# Patient Record
Sex: Male | Born: 1940 | Race: Black or African American | Hispanic: No | Smoking: Former smoker
Health system: Southern US, Community
[De-identification: ages and names within clinical notes are randomized; demographics above are authoritative.]

## PROBLEM LIST (undated history)

## (undated) DIAGNOSIS — H409 Unspecified glaucoma: Secondary | ICD-10-CM

## (undated) DIAGNOSIS — R569 Unspecified convulsions: Secondary | ICD-10-CM

## (undated) DIAGNOSIS — E785 Hyperlipidemia, unspecified: Secondary | ICD-10-CM

## (undated) DIAGNOSIS — G459 Transient cerebral ischemic attack, unspecified: Secondary | ICD-10-CM

## (undated) DIAGNOSIS — D126 Benign neoplasm of colon, unspecified: Secondary | ICD-10-CM

## (undated) DIAGNOSIS — I639 Cerebral infarction, unspecified: Secondary | ICD-10-CM

## (undated) DIAGNOSIS — I10 Essential (primary) hypertension: Secondary | ICD-10-CM

## (undated) DIAGNOSIS — C61 Malignant neoplasm of prostate: Secondary | ICD-10-CM

## (undated) DIAGNOSIS — E119 Type 2 diabetes mellitus without complications: Secondary | ICD-10-CM

## (undated) DIAGNOSIS — M1990 Unspecified osteoarthritis, unspecified site: Secondary | ICD-10-CM

## (undated) HISTORY — PX: DENTAL SURGERY: SHX609

## (undated) HISTORY — DX: Unspecified osteoarthritis, unspecified site: M19.90

## (undated) HISTORY — PX: MENISCUS REPAIR: SHX5179

## (undated) HISTORY — PX: TONSILLECTOMY: SUR1361

## (undated) HISTORY — PX: PROSTATE BIOPSY: SHX241

## (undated) HISTORY — DX: Hyperlipidemia, unspecified: E78.5

## (undated) HISTORY — PX: COLONOSCOPY: SHX174

## (undated) HISTORY — PX: VASECTOMY: SHX75

## (undated) HISTORY — PX: OTHER SURGICAL HISTORY: SHX169

## (undated) HISTORY — PX: POLYPECTOMY: SHX149

## (undated) HISTORY — DX: Malignant neoplasm of prostate: C61

## (undated) HISTORY — DX: Unspecified glaucoma: H40.9

## (undated) HISTORY — DX: Benign neoplasm of colon, unspecified: D12.6

---

## 2006-05-28 ENCOUNTER — Ambulatory Visit: Payer: Self-pay | Admitting: Internal Medicine

## 2006-07-25 ENCOUNTER — Ambulatory Visit: Payer: Self-pay | Admitting: Internal Medicine

## 2007-06-19 ENCOUNTER — Ambulatory Visit: Payer: Self-pay | Admitting: Internal Medicine

## 2007-06-19 LAB — CONVERTED CEMR LAB
Albumin: 4 g/dL (ref 3.5–5.2)
Alkaline Phosphatase: 214 units/L — ABNORMAL HIGH (ref 39–117)
BUN: 9 mg/dL (ref 6–23)
Basophils Absolute: 0.1 10*3/uL (ref 0.0–0.1)
CO2: 27 meq/L (ref 19–32)
Creatinine, Ser: 0.9 mg/dL (ref 0.4–1.5)
GFR calc Af Amer: 109 mL/min
HCT: 42.5 % (ref 39.0–52.0)
Hemoglobin: 14.3 g/dL (ref 13.0–17.0)
Lymphocytes Relative: 35.5 % (ref 12.0–46.0)
MCHC: 33.5 g/dL (ref 30.0–36.0)
Monocytes Absolute: 0.8 10*3/uL — ABNORMAL HIGH (ref 0.2–0.7)
Monocytes Relative: 8.9 % (ref 3.0–11.0)
Neutro Abs: 5.1 10*3/uL (ref 1.4–7.7)
Neutrophils Relative %: 53.3 % (ref 43.0–77.0)
PSA: 1.94 ng/mL (ref 0.10–4.00)
Potassium: 3.9 meq/L (ref 3.5–5.1)
RDW: 13.2 % (ref 11.5–14.6)
Sodium: 136 meq/L (ref 135–145)
TSH: 0.66 microintl units/mL (ref 0.35–5.50)
Total Bilirubin: 0.6 mg/dL (ref 0.3–1.2)
Total Protein: 7.4 g/dL (ref 6.0–8.3)

## 2007-07-02 ENCOUNTER — Ambulatory Visit: Payer: Self-pay | Admitting: Internal Medicine

## 2007-07-03 DIAGNOSIS — Z9189 Other specified personal risk factors, not elsewhere classified: Secondary | ICD-10-CM | POA: Insufficient documentation

## 2007-07-03 DIAGNOSIS — E785 Hyperlipidemia, unspecified: Secondary | ICD-10-CM | POA: Insufficient documentation

## 2007-07-03 DIAGNOSIS — Z8679 Personal history of other diseases of the circulatory system: Secondary | ICD-10-CM | POA: Insufficient documentation

## 2007-07-03 DIAGNOSIS — J309 Allergic rhinitis, unspecified: Secondary | ICD-10-CM | POA: Insufficient documentation

## 2007-07-03 DIAGNOSIS — I10 Essential (primary) hypertension: Secondary | ICD-10-CM | POA: Insufficient documentation

## 2007-07-03 DIAGNOSIS — M479 Spondylosis, unspecified: Secondary | ICD-10-CM | POA: Insufficient documentation

## 2007-07-03 DIAGNOSIS — R569 Unspecified convulsions: Secondary | ICD-10-CM | POA: Insufficient documentation

## 2007-07-05 ENCOUNTER — Ambulatory Visit: Payer: Self-pay | Admitting: Family Medicine

## 2007-07-08 ENCOUNTER — Ambulatory Visit: Payer: Self-pay | Admitting: Internal Medicine

## 2008-01-22 ENCOUNTER — Ambulatory Visit: Payer: Self-pay | Admitting: Internal Medicine

## 2008-06-23 ENCOUNTER — Ambulatory Visit: Payer: Self-pay | Admitting: Internal Medicine

## 2008-06-28 LAB — CONVERTED CEMR LAB
Albumin: 3.7 g/dL (ref 3.5–5.2)
Alkaline Phosphatase: 118 units/L — ABNORMAL HIGH (ref 39–117)
BUN: 11 mg/dL (ref 6–23)
Basophils Relative: 1 % (ref 0.0–1.0)
Cholesterol: 156 mg/dL (ref 0–200)
Creatinine, Ser: 0.9 mg/dL (ref 0.4–1.5)
Eosinophils Relative: 1.4 % (ref 0.0–5.0)
GFR calc Af Amer: 109 mL/min
Glucose, Bld: 116 mg/dL — ABNORMAL HIGH (ref 70–99)
HCT: 42 % (ref 39.0–52.0)
Hemoglobin: 14.2 g/dL (ref 13.0–17.0)
MCV: 90.2 fL (ref 78.0–100.0)
Monocytes Absolute: 0.5 10*3/uL (ref 0.1–1.0)
Monocytes Relative: 6.3 % (ref 3.0–12.0)
Neutro Abs: 4.5 10*3/uL (ref 1.4–7.7)
Nitrite: NEGATIVE
PSA: 2.45 ng/mL (ref 0.10–4.00)
Platelets: 384 10*3/uL (ref 150–400)
Potassium: 4.1 meq/L (ref 3.5–5.1)
RBC: 4.65 M/uL (ref 4.22–5.81)
Specific Gravity, Urine: 1.02 (ref 1.000–1.03)
Total Protein, Urine: NEGATIVE mg/dL
Total Protein: 7 g/dL (ref 6.0–8.3)
Urine Glucose: NEGATIVE mg/dL
WBC: 8.3 10*3/uL (ref 4.5–10.5)
pH: 6 (ref 5.0–8.0)

## 2008-07-22 ENCOUNTER — Ambulatory Visit: Payer: Self-pay | Admitting: Internal Medicine

## 2010-10-10 DIAGNOSIS — D126 Benign neoplasm of colon, unspecified: Secondary | ICD-10-CM

## 2010-10-10 HISTORY — DX: Benign neoplasm of colon, unspecified: D12.6

## 2010-10-18 ENCOUNTER — Encounter (INDEPENDENT_AMBULATORY_CARE_PROVIDER_SITE_OTHER): Payer: Self-pay | Admitting: *Deleted

## 2010-10-20 ENCOUNTER — Encounter (INDEPENDENT_AMBULATORY_CARE_PROVIDER_SITE_OTHER): Payer: Self-pay | Admitting: *Deleted

## 2010-10-25 ENCOUNTER — Ambulatory Visit: Payer: Self-pay | Admitting: Gastroenterology

## 2010-10-25 ENCOUNTER — Encounter (INDEPENDENT_AMBULATORY_CARE_PROVIDER_SITE_OTHER): Payer: Self-pay | Admitting: *Deleted

## 2010-11-01 ENCOUNTER — Ambulatory Visit: Payer: Self-pay | Admitting: Gastroenterology

## 2010-11-06 ENCOUNTER — Encounter: Payer: Self-pay | Admitting: Gastroenterology

## 2010-11-15 ENCOUNTER — Telehealth: Payer: Self-pay | Admitting: Gastroenterology

## 2011-01-09 NOTE — Letter (Signed)
Summary: Diabetic Instructions  [facility]  [address] [place], [place] [address]   Phone: [phone]  Fax: [phone]    Anthony Walters 11-May-1941 MRN: [id number]   (Metformin)   ORAL DIABETIC MEDICATION INSTRUCTIONS  The day before your procedure:   Take your diabetic pill as you do normally  The day of your procedure:   Do not take your diabetic pill    We will check your blood sugar levels during the admission process and again in Recovery before discharging you home  ________________________________________________________________________

## 2011-01-09 NOTE — Progress Notes (Signed)
Summary: previsit letter ?'s  Phone Note Call from Patient Call back at [phone]   Caller: Patient Call For: Dr. Russella Dar Reason for Call: Talk to Nurse Summary of Call: previsit letter ?'s Initial call taken by: Vallarie Mare,  November 15, 2010 3:48 PM  Follow-up for Phone Call        Left message for patient  to call back. Follow-up by: Christie Nottingham CMA Duncan Dull),  November 15, 2010 4:01 PM  Additional Follow-up for Phone Call Additional follow up Details #1::        Left message for patient  to call back. Additional Follow-up by: Christie Nottingham CMA Duncan Dull),  November 16, 2010 2:55 PM    Additional Follow-up for Phone Call Additional follow up Details #2::    Pt states he recieved some information regarding his previsit appt after his procedure and he doesn't understand why b/c he already had his procedure. Told him to disregard those papers now he has already had his procedure.  Follow-up by: Christie Nottingham CMA Duncan Dull),  November 17, 2010 11:20 AM

## 2011-01-09 NOTE — Letter (Signed)
Summary: [facility] Instructions  [facility]  [address] [place], [place] [address]   Phone: [phone]  Fax: [phone]       Anthony Walters    [birth year]    MRN: [id number]        Procedure Day Dorna Bloom: Wednesday 11-01-10     Arrival Time: 8:00 am     Procedure Time: 9:00 am     Location of Procedure:                    _x _   [facility] (4th [facility]) _ _  [facility] ( Outpatient Registration) _ _  [facility] ( Short Stay on [facility])                       PREPARATION FOR COLONOSCOPY WITH MOVIPREP   Starting 5 days prior to your procedure  10-27-10 do not eat nuts, seeds, popcorn, corn, beans, peas,  salads, or any raw vegetables.  Do not take any fiber supplements (e.g. Metamucil, Citrucel, and Benefiber).  THE DAY BEFORE YOUR PROCEDURE         DATE:  10-31-10  DAY: Tuesday  1.  Drink clear liquids the entire day-NO SOLID FOOD  2.  Do not drink anything colored red or purple.  Avoid juices with pulp.  No orange juice.  3.  Drink at least 64 oz. (8 glasses) of fluid/clear liquids during the day to prevent dehydration and help the prep work efficiently.  CLEAR LIQUIDS INCLUDE: Water Jello Ice Popsicles Tea (sugar ok, no milk/cream) Powdered fruit flavored drinks Coffee (sugar ok, no milk/cream) Gatorade Juice: apple, white grape, white cranberry  Lemonade Clear bullion, consomm, broth Carbonated beverages (any kind) Strained chicken noodle soup Hard Candy                             4.  In the morning, mix first dose of MoviPrep solution:    Empty 1 Pouch A and 1 Pouch B into the disposable container    Add lukewarm drinking water to the top line of the container. Mix to dissolve    Refrigerate (mixed solution should be used within 24 hrs)  5.  Begin drinking the prep at 5:00 p.m. The MoviPrep container is divided by 4 marks.   Every 15 minutes drink the solution down to the next mark (approximately 8  oz) until the full liter is complete.   6.  Follow completed prep with 16 oz of clear liquid of your choice (Nothing red or purple).  Continue to drink clear liquids until bedtime.  7.  Before going to bed, mix second dose of MoviPrep solution:    Empty 1 Pouch A and 1 Pouch B into the disposable container    Add lukewarm drinking water to the top line of the container. Mix to dissolve    Refrigerate  THE DAY OF YOUR PROCEDURE      DATE:  11-01-10  DAY:  Wednesday  Beginning at  4:00 a.m. (5 hours before procedure):         1. Every 15 minutes, drink the solution down to the next mark (approx 8 oz) until the full liter is complete.  2. Follow completed prep with 16 oz. of clear liquid of your choice.    3. You may drink clear liquids until  7:00 a.m. (2 HOURS BEFORE PROCEDURE).  MEDICATION INSTRUCTIONS  Unless otherwise instructed, you should take regular prescription medications with a small sip of water   as early as possible the morning of your procedure.  Diabetic patients - see separate instructions.  Additional medication instructions: Do not take HCTZ day of procedure.         OTHER INSTRUCTIONS  You will need a responsible adult at least 68 years of age to accompany you and drive you home.   This person must remain in the waiting room during your procedure.  Wear loose fitting clothing that is easily removed.  Leave jewelry and other valuables at home.  However, you may wish to bring a book to read or  an iPod/MP3 player to listen to music as you wait for your procedure to start.  Remove all body piercing jewelry and leave at home.  Total time from sign-in until discharge is approximately 2-3 hours.  You should go home directly after your procedure and rest.  You can resume normal activities the  day after your procedure.  The day of your procedure you should not:   Drive   Make legal decisions   Operate machinery   Drink alcohol   Return to  work  You will receive specific instructions about eating, activities and medications before you leave.    The above instructions have been reviewed and explained to me by   Ezra Sites RN  October 25, 2010 9:10 AM    I fully understand and can verbalize these instructions _____________________________ Date _________

## 2011-01-09 NOTE — Miscellaneous (Signed)
Summary: [facility]  Clinical Lists Changes  Medications: Added new medication of MOVIPREP 100 GM  SOLR (PEG-KCL-NACL-NASULF-NA ASC-C) As per prep instructions. - Signed Rx of MOVIPREP 100 GM  SOLR (PEG-KCL-NACL-NASULF-NA ASC-C) As per prep instructions.;  #1 x 0;  Signed;  Entered by: Ezra Sites RN;  Authorized by: Meryl Dare MD [facility]  Method used: Electronically to [facility] [address], [place], [place], [place]  [address], Ph: [phone], Fax: [phone] Allergies: Changed allergy or adverse reaction from CIPRO to CIPRO    Prescriptions: MOVIPREP 100 GM  SOLR (PEG-KCL-NACL-NASULF-NA ASC-C) As per prep instructions.  #1 x 0   Entered by:   Ezra Sites RN   Authorized by:   Meryl Dare MD [facility]   Signed by:   Ezra Sites RN on 10/25/2010   Method used:   Electronically to        [facility] [address] (retail)       [address]       [place], [place]  [address]       Ph: [phone]       Fax: [phone]   RxID:   [id number]

## 2011-01-09 NOTE — Letter (Signed)
Summary: Patient Notice- Polyp Results  [facility]  [address] [place], [place] [address]   Phone: [phone]  Fax: [phone]        November 06, 2010 MRN: [id number]    [facility] [address] [place], [place]  [address]    Dear Mr. Anthony Walters,  I am pleased to inform you that the colon polyp(s) removed during your recent colonoscopy was (were) found to be benign (no cancer detected) upon pathologic examination.  I recommend you have a repeat colonoscopy examination in 5 years to look for recurrent polyps, as having colon polyps increases your risk for having recurrent polyps or even colon cancer in the future.  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  Should you develop new or worsening symptoms of abdominal pain, bowel habit changes or bleeding from the rectum or bowels, please schedule an evaluation with either your primary care physician or with me.  Continue treatment plan as outlined the day of your exam.  Please call us if you are having persistent problems or have questions about your condition that have not been fully answered at this time.  Sincerely,  Meryl Dare MD [facility]  This letter has been electronically signed by your physician.  Appended Document: Patient Notice- Polyp Results Letter mailed

## 2011-01-09 NOTE — Letter (Signed)
Summary: Pre Visit Letter Revised  [facility]  [address] [place], [place] [address]   Phone: [phone]  Fax: [phone]        10/18/2010 MRN: [id number] Anthony Walters [address] [place], [place]  [address]             Procedure Date:  11/01/2010   Welcome to the Gastroenterology Division at [facility].    You are scheduled to see a nurse for your pre-procedure visit on 10/25/2010 at 8:30AM on the 3rd floor at [facility], [address]  We ask that you try to arrive at our office 15 minutes prior to your appointment time to allow for check-in.  Please take a minute to review the attached form.  If you answer "Yes" to one or more of the questions on the first page, we ask that you call the person listed at your earliest opportunity.  If you answer "No" to all of the questions, please complete the rest of the form and bring it to your appointment.    Your nurse visit will consist of discussing your medical and surgical history, your immediate family medical history, and your medications.   If you are unable to list all of your medications on the form, please bring the medication bottles to your appointment and we will list them.  We will need to be aware of both prescribed and over the counter drugs.  We will need to know exact dosage information as well.    Please be prepared to read and sign documents such as consent forms, a financial agreement, and acknowledgement forms.  If necessary, and with your consent, a friend or relative is welcome to sit-in on the nurse visit with you.  Please bring your insurance card so that we may make a copy of it.  If your insurance requires a referral to see a specialist, please bring your referral form from your primary care physician.  No co-pay is required for this nurse visit.     If you cannot keep your appointment, please call [phone] to cancel or reschedule prior to your appointment date.   This allows [place] the opportunity to schedule an appointment for another patient in need of care.    Thank you for choosing  Gastroenterology for your medical needs.  We appreciate the opportunity to care for you.  Please visit [place] at our website  to learn more about our practice.  Sincerely, The Gastroenterology Division

## 2011-01-09 NOTE — Procedures (Signed)
Summary: Colonoscopy  Patient: Anthony Walters Note: All result statuses are Final unless otherwise noted.  Tests: (1) Colonoscopy (COL)   COL Colonoscopy           DONE     [facility]     [address]     [place], [place]  [address]           COLONOSCOPY PROCEDURE REPORT     PATIENT:  Anthony Walters, Anthony Walters  MR#:  [id number]     BIRTHDATE:  1941/01/01, 69 yrs. old  GENDER:  male     ENDOSCOPIST:  Judie Petit T. Russella Dar, MD, [facility]     Referred by:  Jarome Matin, M.D.     PROCEDURE DATE:  11/01/2010     PROCEDURE:  Colonoscopy with biopsy, with snare polypectomy, with     hot biopsy     ASA CLASS:  Class II     INDICATIONS:  1) Routine Risk Screening     MEDICATIONS:   Fentanyl 100 mcg IV, Versed 10 mg IV     DESCRIPTION OF PROCEDURE:   After the risks benefits and     alternatives of the procedure were thoroughly explained, informed     consent was obtained.  Digital rectal exam was performed and     revealed no abnormalities.   The LB [id number]-[id number] endoscope     was introduced through the anus and advanced to the cecum, which     was identified by both the appendix and ileocecal valve, limited     by a tortuous colon.  The quality of the prep was good, using     MoviPrep.  The instrument was then slowly withdrawn as the colon     was fully examined.     <<PROCEDUREIMAGES>>     FINDINGS:  Focal erosions in the cecum. Multiple biopsies were     obtained and sent to pathology.  Two polyps were found in the     ascending colon. They were 4 - 5 mm in size. One polyp was removed     using hot biopsy forceps. The other polyp was snared without     cautery. Retrieval was successful. A sessile polyp was found at     the ileocecal valve. It was 6 mm in size. The polyp was removed     using hot biopsy forceps.  A sessile polyp was found in the     descending colon. It was 5 mm in size. Polyp was snared without     cautery. Retrieval was successful.  This was otherwise a normal   examination of the colon. Retroflexed views in the rectum revealed     internal hemorrhoids, moderate. The time to cecum =  7.67     minutes. The scope was then withdrawn (time =  14.75  min) from the     patient and the procedure completed.           COMPLICATIONS:  None           ENDOSCOPIC IMPRESSION:     1) Erosions in the cecum     2) 4 - 5 mm Two polyps in the ascending colon     3) 6 mm sessile polyp at the ileocecal valve     4) 5 mm sessile polyp in the descending colon     5) Internal hemorrhoids           RECOMMENDATIONS:     1) Hold aspirin, aspirin products,  and anti-inflamatory     medication for 2 weeks.     2) Await pathology results     3) If the polyps removed today are adenomatous polyps,     colonoscopy in 5 years. Otherwise follow colorectal cancer     screening guidelines for "routine risk" patients with colonoscopy     in 10 years.           Venita Lick. Russella Dar, MD, [facility]           n.     eSIGNED:   Venita Lick. Stark at 11/01/2010 09:27 AM           Cato Mulligan, [id number]  Note: An exclamation mark (!) indicates a result that was not dispersed into the flowsheet. Document Creation Date: 11/01/2010 9:27 AM _______________________________________________________________________  (1) Order result status: Final Collection or observation date-time: 11/01/2010 09:18 Requested date-time:  Receipt date-time:  Reported date-time:  Referring Physician:   Ordering Physician: Claudette Head [phone]) Specimen Source:  Source: Launa Grill Order Number: [id number] Lab site:   Appended Document: Colonoscopy     Procedures Next Due Date:    Colonoscopy: 10/2015

## 2011-02-20 LAB — GLUCOSE, CAPILLARY: Glucose-Capillary: 103 mg/dL — ABNORMAL HIGH (ref 70–99)

## 2013-05-31 ENCOUNTER — Emergency Department (HOSPITAL_COMMUNITY)
Admission: EM | Admit: 2013-05-31 | Discharge: 2013-05-31 | Disposition: A | Discharge status: Home / Self Care | Payer: Medicare Other | Attending: Emergency Medicine | Admitting: Emergency Medicine

## 2013-05-31 ENCOUNTER — Emergency Department (HOSPITAL_COMMUNITY): Payer: Medicare Other

## 2013-05-31 ENCOUNTER — Encounter (HOSPITAL_COMMUNITY): Payer: Self-pay | Admitting: Emergency Medicine

## 2013-05-31 DIAGNOSIS — R141 Gas pain: Secondary | ICD-10-CM | POA: Insufficient documentation

## 2013-05-31 DIAGNOSIS — Z8673 Personal history of transient ischemic attack (TIA), and cerebral infarction without residual deficits: Secondary | ICD-10-CM | POA: Insufficient documentation

## 2013-05-31 DIAGNOSIS — Z79899 Other long term (current) drug therapy: Secondary | ICD-10-CM | POA: Insufficient documentation

## 2013-05-31 DIAGNOSIS — R142 Eructation: Secondary | ICD-10-CM | POA: Insufficient documentation

## 2013-05-31 DIAGNOSIS — Y939 Activity, unspecified: Secondary | ICD-10-CM | POA: Insufficient documentation

## 2013-05-31 DIAGNOSIS — I10 Essential (primary) hypertension: Secondary | ICD-10-CM | POA: Insufficient documentation

## 2013-05-31 DIAGNOSIS — S4980XA Other specified injuries of shoulder and upper arm, unspecified arm, initial encounter: Secondary | ICD-10-CM | POA: Insufficient documentation

## 2013-05-31 DIAGNOSIS — R109 Unspecified abdominal pain: Secondary | ICD-10-CM | POA: Insufficient documentation

## 2013-05-31 DIAGNOSIS — Z7982 Long term (current) use of aspirin: Secondary | ICD-10-CM | POA: Insufficient documentation

## 2013-05-31 DIAGNOSIS — E119 Type 2 diabetes mellitus without complications: Secondary | ICD-10-CM | POA: Insufficient documentation

## 2013-05-31 DIAGNOSIS — K8020 Calculus of gallbladder without cholecystitis without obstruction: Secondary | ICD-10-CM

## 2013-05-31 DIAGNOSIS — S46909A Unspecified injury of unspecified muscle, fascia and tendon at shoulder and upper arm level, unspecified arm, initial encounter: Secondary | ICD-10-CM | POA: Insufficient documentation

## 2013-05-31 DIAGNOSIS — F17200 Nicotine dependence, unspecified, uncomplicated: Secondary | ICD-10-CM | POA: Insufficient documentation

## 2013-05-31 DIAGNOSIS — X58XXXA Exposure to other specified factors, initial encounter: Secondary | ICD-10-CM | POA: Insufficient documentation

## 2013-05-31 DIAGNOSIS — Y929 Unspecified place or not applicable: Secondary | ICD-10-CM | POA: Insufficient documentation

## 2013-05-31 DIAGNOSIS — R143 Flatulence: Secondary | ICD-10-CM | POA: Insufficient documentation

## 2013-05-31 HISTORY — DX: Cerebral infarction, unspecified: I63.9

## 2013-05-31 HISTORY — DX: Transient cerebral ischemic attack, unspecified: G45.9

## 2013-05-31 HISTORY — DX: Essential (primary) hypertension: I10

## 2013-05-31 HISTORY — DX: Type 2 diabetes mellitus without complications: E11.9

## 2013-05-31 LAB — HEPATIC FUNCTION PANEL
ALT: 7 U/L (ref 0–53)
AST: 14 U/L (ref 0–37)
Albumin: 4.2 g/dL (ref 3.5–5.2)
Alkaline Phosphatase: 121 U/L — ABNORMAL HIGH (ref 39–117)
Total Bilirubin: 0.1 mg/dL — ABNORMAL LOW (ref 0.3–1.2)
Total Protein: 7.3 g/dL (ref 6.0–8.3)

## 2013-05-31 LAB — URINALYSIS, ROUTINE W REFLEX MICROSCOPIC
Bilirubin Urine: NEGATIVE
Glucose, UA: NEGATIVE mg/dL
Hgb urine dipstick: NEGATIVE
Specific Gravity, Urine: 1.013 (ref 1.005–1.030)
Urobilinogen, UA: 1 mg/dL (ref 0.0–1.0)
pH: 6.5 (ref 5.0–8.0)

## 2013-05-31 LAB — BASIC METABOLIC PANEL
BUN: 10 mg/dL (ref 6–23)
Calcium: 9.3 mg/dL (ref 8.4–10.5)
Creatinine, Ser: 0.92 mg/dL (ref 0.50–1.35)
GFR calc Af Amer: >90 mL/min (ref >90)
GFR calc non Af Amer: 83 mL/min — ABNORMAL LOW (ref >90)
Glucose, Bld: 189 mg/dL — ABNORMAL HIGH (ref 70–99)
Potassium: 4.1 mEq/L (ref 3.5–5.1)

## 2013-05-31 LAB — CBC
HCT: 38.9 % — ABNORMAL LOW (ref 39.0–52.0)
Hemoglobin: 12.9 g/dL — ABNORMAL LOW (ref 13.0–17.0)
MCH: 29 pg (ref 26.0–34.0)
MCHC: 33.2 g/dL (ref 30.0–36.0)
RDW: 13.5 % (ref 11.5–15.5)

## 2013-05-31 LAB — LIPASE, BLOOD: Lipase: 42 U/L (ref 11–59)

## 2013-05-31 MED ORDER — SODIUM CHLORIDE 0.9 % IV BOLUS (SEPSIS)
250.0000 mL | Freq: Once | INTRAVENOUS | Status: AC
  Administered 2013-05-31: 250 mL via INTRAVENOUS

## 2013-05-31 MED ORDER — IOHEXOL 300 MG/ML  SOLN
100.0000 mL | Freq: Once | INTRAMUSCULAR | Status: AC | PRN
  Administered 2013-05-31: 100 mL via INTRAVENOUS

## 2013-05-31 MED ORDER — TRAMADOL HCL 50 MG PO TABS: 50.0000 mg | ORAL_TABLET | Freq: Four times a day (QID) | ORAL | Status: DC | PRN

## 2013-05-31 MED ORDER — SODIUM CHLORIDE 0.9 % IV SOLN
INTRAVENOUS | Status: DC
  Administered 2013-05-31: 17:00:00 via INTRAVENOUS

## 2013-05-31 MED ORDER — ONDANSETRON HCL 4 MG/2ML IJ SOLN
4.0000 mg | Freq: Once | INTRAMUSCULAR | Status: AC
  Administered 2013-05-31: 4 mg via INTRAVENOUS
  Filled 2013-05-31: qty 2

## 2013-05-31 MED ORDER — IOHEXOL 300 MG/ML  SOLN: 50.0000 mL | Freq: Once | INTRAMUSCULAR | Status: DC | PRN

## 2013-05-31 NOTE — ED Notes (Signed)
Pt reports cp with left arm pain and numbness x 1 week. Pt reports fullness feeling in chest.

## 2013-05-31 NOTE — ED Provider Notes (Addendum)
History     CSN: [id number]  Arrival date & time 05/31/13  1453   First MD Initiated Contact with Patient 05/31/13 1547      Chief Complaint  Patient presents with  . Chest Pain    (Consider location/radiation/quality/duration/timing/severity/associated sxs/prior treatment) The history is provided by the patient.   72 year old gentleman with complaint of abdominal fullness and left shoulder discomfort for over a week most likely for a month. Associated with decreased appetite but no weight loss. No nausea vomiting or diarrhea. Patient with a left shoulder injury and he attributes the discomfort he gets in the shoulder area and the arm that radiates up into the neck and clavicle area to be related to that. No anterior chest pain. The abdominal discomfort as a 2/10. Nonradiating described as a fullness may be a slight 8. Not made better or worse by anything.  Past Medical History  Diagnosis Date  . Diabetes mellitus without complication   . Stroke   . TIA (transient ischemic attack)   . Hypertension     Past Surgical History  Procedure Laterality Date  . Knee surgery      No family history on file.  History  Substance Use Topics  . Smoking status: Current Every Day Smoker    Types: Cigars  . Smokeless tobacco: Not on file  . Alcohol Use: No      Review of Systems  Constitutional: Positive for appetite change. Negative for fever.  HENT: Positive for neck pain.   Eyes: Negative for redness.  Respiratory: Negative for shortness of breath.   Cardiovascular: Negative for chest pain.  Gastrointestinal: Positive for abdominal pain and abdominal distention. Negative for nausea and vomiting.  Genitourinary: Negative for dysuria.  Musculoskeletal: Negative for back pain.  Skin: Negative for rash.  Neurological: Negative for headaches.  Hematological: Does not bruise/bleed easily.  Psychiatric/Behavioral: Negative for confusion.    Allergies  Ace inhibitors and  Ciprofloxacin  Home Medications   Current Outpatient Rx  Name  Route  Sig  Dispense  Refill  . amLODipine (NORVASC) 10 MG tablet   Oral   Take 10 mg by mouth daily.         . bimatoprost (LUMIGAN) 0.01 % SOLN   Both Ears   Place 1 drop into both ears at bedtime.         . brinzolamide (AZOPT) 1 % ophthalmic suspension   Ophthalmic   Apply 1 drop to eye 3 (three) times daily.         . cloNIDine (CATAPRES) 0.2 MG tablet   Oral   Take 0.2 mg by mouth 3 (three) times daily.         Marland Kitchen gemfibrozil (LOPID) 600 MG tablet   Oral   Take 600 mg by mouth 2 (two) times daily.         . hydrochlorothiazide (HYDRODIURIL) 25 MG tablet   Oral   Take 25 mg by mouth daily.         Marland Kitchen HYDROcodone-acetaminophen (NORCO/VICODIN) 5-325 MG per tablet   Oral   Take 1-2 tablets by mouth every 8 (eight) hours as needed for pain.         Marland Kitchen labetalol (NORMODYNE) 300 MG tablet   Oral   Take 300 mg by mouth 2 (two) times daily.         . meloxicam (MOBIC) 15 MG tablet   Oral   Take 15 mg by mouth daily as needed for pain.         Marland Kitchen  metFORMIN (GLUMETZA) 500 MG (MOD) 24 hr tablet   Oral   Take 500 mg by mouth daily with supper.         . minoxidil (LONITEN) 2.5 MG tablet   Oral   Take 2.5 mg by mouth at bedtime.         . sulfamethoxazole-trimethoprim (BACTRIM DS) 800-160 MG per tablet   Oral   Take 1 tablet by mouth 2 (two) times daily.         Marland Kitchen aspirin 81 MG chewable tablet   Oral   Chew 81 mg by mouth daily.         . traMADol (ULTRAM) 50 MG tablet   Oral   Take 1 tablet (50 mg total) by mouth every 6 (six) hours as needed for pain.   15 tablet   0     BP 170/60  Pulse 95  Temp(Src) 98.2 F (36.8 C) (Oral)  Resp 20  SpO2 97%  Physical Exam  Nursing note and vitals reviewed. Constitutional: He is oriented to person, place, and time. He appears well-developed and well-nourished. No distress.  HENT:  Head: Normocephalic.  Mouth/Throat: Oropharynx  is clear and moist.  Eyes: Conjunctivae and EOM are normal. Pupils are equal, round, and reactive to light.  Neck: Normal range of motion. Neck supple.  Cardiovascular: Normal rate, regular rhythm and normal heart sounds.   No murmur heard. Pulmonary/Chest: Effort normal and breath sounds normal. No respiratory distress.  Abdominal: Soft. Bowel sounds are normal. He exhibits distension. There is no tenderness.  Musculoskeletal: Normal range of motion. He exhibits no edema and no tenderness.  Left shoulder with palpable 3 x 5 cm soft tissue mass consistent with a lipoma.  Neurological: He is alert and oriented to person, place, and time. No cranial nerve deficit. He exhibits normal muscle tone. Coordination normal.  Skin: Skin is warm. No rash noted.    ED Course  Procedures (including critical care time)  Labs Reviewed  CBC - Abnormal; Notable for the following:    Hemoglobin 12.9 (*)    HCT 38.9 (*)    All other components within normal limits  BASIC METABOLIC PANEL - Abnormal; Notable for the following:    Glucose, Bld 189 (*)    GFR calc non Af Amer 83 (*)    All other components within normal limits  HEPATIC FUNCTION PANEL - Abnormal; Notable for the following:    Alkaline Phosphatase 121 (*)    Total Bilirubin 0.1 (*)    All other components within normal limits  PRO B NATRIURETIC PEPTIDE  TROPONIN I  URINALYSIS, ROUTINE W REFLEX MICROSCOPIC  LIPASE, BLOOD  POCT I-STAT TROPONIN I   Dg Chest 2 View  05/31/2013   *RADIOLOGY REPORT*  Clinical Data:  chest pain and shortness of breath  CHEST - 2 VIEW  Comparison: None  Findings: Normal heart size.  No pleural effusion or edema.  Lung volumes are low.  There is dependent changes and mild bibasilar atelectasis noted.  No airspace consolidation.  IMPRESSION:  1.  Low lung volumes with dependent changes and bibasilar atelectasis.   Original Report Authenticated By: Signa Kell, M.D.   Ct Abdomen Pelvis W Contrast  05/31/2013    *RADIOLOGY REPORT*  Clinical Data: Chest pain and left arm pain and numbness.  Fullness of the chest and abdomen.  CT ABDOMEN AND PELVIS WITH CONTRAST  Technique:  Multidetector CT imaging of the abdomen and pelvis was performed following the standard protocol during  bolus administration of intravenous contrast.  Contrast: OMNIPAQUE IOHEXOL 300 MG/ML  SOLN  Comparison: None.  Findings: There are multiple noncalcified stones in the nondistended gallbladder.  Biliary tree is normal.  Liver parenchyma, spleen, pancreas, adrenal glands, and kidneys demonstrate no significant abnormalities.  There are two cysts in the mid right kidney, 2.0 cm and 1.4 cm.  There is a 0.8 cm cyst in the mid left kidney. 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  Scattered calcifications in the abdominal aorta and iliac arteries.  The bladder and prostate gland appear normal.  No free air or free fluid.  The bowel is normal including the terminal ileum and appendix.  No diverticular disease.  No acute osseous abnormality.  IMPRESSION:  1.  Cholelithiasis. 2.  No acute abnormalities of the abdomen or pelvis. 3.  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   Original Report Authenticated By: Francene Boyers, M.D.     Date: 05/31/2013  Rate: 94  Rhythm: normal sinus rhythm  QRS Axis: normal  Intervals: normal  ST/T Wave abnormalities: nonspecific T wave changes  Conduction Disutrbances:none  Narrative Interpretation:   Old EKG Reviewed: none available  Results for orders placed during the hospital encounter of 05/31/13  CBC      Result Value Range   WBC 6.3  4.0 - 10.5 K/uL   RBC 4.45  4.22 - 5.81 MIL/uL   Hemoglobin 12.9 (*) 13.0 - 17.0 g/dL   HCT 16.1 (*) 09.6 - 04.5 %   MCV 87.4  78.0 - 100.0 fL   MCH 29.0  26.0 - 34.0 pg   MCHC 33.2  30.0 - 36.0 g/dL   RDW 40.9  81.1 - 91.4 %   Platelets 317  150 - 400 K/uL  BASIC METABOLIC PANEL      Result Value Range   Sodium 136  135 - 145 mEq/L    Potassium 4.1  3.5 - 5.1 mEq/L   Chloride 99  96 - 112 mEq/L   CO2 25  19 - 32 mEq/L   Glucose, Bld 189 (*) 70 - 99 mg/dL   BUN 10  6 - 23 mg/dL   Creatinine, Ser 7.82  0.50 - 1.35 mg/dL   Calcium 9.3  8.4 - 95.6 mg/dL   GFR calc non Af Amer 83 (*) >90 mL/min   GFR calc Af Amer >90  >90 mL/min  PRO B NATRIURETIC PEPTIDE      Result Value Range   Pro B Natriuretic peptide (BNP) 34.2  0 - 125 pg/mL  TROPONIN I      Result Value Range   Troponin I <0.30  <0.30 ng/mL  URINALYSIS, ROUTINE W REFLEX MICROSCOPIC      Result Value Range   Color, Urine YELLOW  YELLOW   APPearance CLEAR  CLEAR   Specific Gravity, Urine 1.013  1.005 - 1.030   pH 6.5  5.0 - 8.0   Glucose, UA NEGATIVE  NEGATIVE mg/dL   Hgb urine dipstick NEGATIVE  NEGATIVE   Bilirubin Urine NEGATIVE  NEGATIVE   Ketones, ur NEGATIVE  NEGATIVE mg/dL   Protein, ur NEGATIVE  NEGATIVE mg/dL   Urobilinogen, UA 1.0  0.0 - 1.0 mg/dL   Nitrite NEGATIVE  NEGATIVE   Leukocytes, UA NEGATIVE  NEGATIVE  HEPATIC FUNCTION PANEL      Result Value Range   Total Protein 7.3  6.0 - 8.3 g/dL   Albumin 4.2  3.5 - 5.2  g/dL   AST 14  0 - 37 U/L   ALT 7  0 - 53 U/L   Alkaline Phosphatase 121 (*) 39 - 117 U/L   Total Bilirubin 0.1 (*) 0.3 - 1.2 mg/dL   Bilirubin, Direct <8.2  0.0 - 0.3 mg/dL   Indirect Bilirubin NOT CALCULATED  0.3 - 0.9 mg/dL  LIPASE, BLOOD      Result Value Range   Lipase 42  11 - 59 U/L  POCT I-STAT TROPONIN I      Result Value Range   Troponin i, poc 0.02  0.00 - 0.08 ng/mL   Comment 3              1. Abdominal pain   2. Cholelithiasis       MDM  Patient with 2 separate complaints. One was abdominal pain mostly epigastric but a fullness feeling. And left arm pain and neck pain no true chest pain. No complaint of chest pain. Troponin was negative the symptoms have all been present for a week. As stated troponin was negative EKG without acute cardiac changes. CT of the abdomen findings gallstones possible  symptoms could be related to biliary colic that would be the abdominal symptoms. But not 100% convinced to 2 related. No evidence of pancreatitis. Bilirubin is not elevated. Alkaline phosphatase is elevated no signs of urinary tract infection. No leukocytosis. Chest x-ray was negative for pneumonia pneumothorax or pulmonary edema. Patient in no acute distress in the emergency department. Patient will be discharged with followup with his primary care Dr. and referral to general surgery. Patient will be treated with tramadol as needed for the discomfort. Patient will return for any newer worse symptoms.  No evidence of acute cardiac or pulmonary event. Abdominal symptoms could be related to the cholelithiasis. No evidence of acute cholecystitis or prolonged biliary colic.       Shelda Jakes, MD 05/31/13 1943  Shelda Jakes, MD 05/31/13 1946

## 2013-09-09 DIAGNOSIS — C61 Malignant neoplasm of prostate: Secondary | ICD-10-CM

## 2013-09-09 HISTORY — DX: Malignant neoplasm of prostate: C61

## 2014-01-08 ENCOUNTER — Encounter (HOSPITAL_COMMUNITY): Payer: Self-pay | Admitting: Emergency Medicine

## 2014-01-08 ENCOUNTER — Emergency Department (HOSPITAL_COMMUNITY)
Admission: EM | Admit: 2014-01-08 | Discharge: 2014-01-08 | Disposition: A | Discharge status: Home / Self Care | Payer: Medicare Other | Attending: Emergency Medicine | Admitting: Emergency Medicine

## 2014-01-08 DIAGNOSIS — Z87891 Personal history of nicotine dependence: Secondary | ICD-10-CM | POA: Insufficient documentation

## 2014-01-08 DIAGNOSIS — F17200 Nicotine dependence, unspecified, uncomplicated: Secondary | ICD-10-CM | POA: Insufficient documentation

## 2014-01-08 DIAGNOSIS — Z8673 Personal history of transient ischemic attack (TIA), and cerebral infarction without residual deficits: Secondary | ICD-10-CM | POA: Insufficient documentation

## 2014-01-08 DIAGNOSIS — Z008 Encounter for other general examination: Secondary | ICD-10-CM | POA: Insufficient documentation

## 2014-01-08 DIAGNOSIS — I10 Essential (primary) hypertension: Secondary | ICD-10-CM | POA: Insufficient documentation

## 2014-01-08 DIAGNOSIS — Z79899 Other long term (current) drug therapy: Secondary | ICD-10-CM | POA: Insufficient documentation

## 2014-01-08 DIAGNOSIS — Z0000 Encounter for general adult medical examination without abnormal findings: Secondary | ICD-10-CM

## 2014-01-08 DIAGNOSIS — E119 Type 2 diabetes mellitus without complications: Secondary | ICD-10-CM | POA: Insufficient documentation

## 2014-01-08 HISTORY — DX: Unspecified convulsions: R56.9

## 2014-01-08 LAB — COMPREHENSIVE METABOLIC PANEL
ALT: 7 U/L (ref 0–53)
AST: 17 U/L (ref 0–37)
Albumin: 4.5 g/dL (ref 3.5–5.2)
Alkaline Phosphatase: 91 U/L (ref 39–117)
BUN: 9 mg/dL (ref 6–23)
CO2: 25 meq/L (ref 19–32)
CREATININE: 0.77 mg/dL (ref 0.50–1.35)
Calcium: 9.7 mg/dL (ref 8.4–10.5)
Chloride: 99 mEq/L (ref 96–112)
GFR calc non Af Amer: 90 mL/min — ABNORMAL LOW (ref >90)
Glucose, Bld: 97 mg/dL (ref 70–99)
Potassium: 3.6 mEq/L — ABNORMAL LOW (ref 3.7–5.3)
SODIUM: 139 meq/L (ref 137–147)
Total Bilirubin: 0.3 mg/dL (ref 0.3–1.2)
Total Protein: 7.9 g/dL (ref 6.0–8.3)

## 2014-01-08 LAB — RAPID URINE DRUG SCREEN, HOSP PERFORMED
Amphetamines: NOT DETECTED
Barbiturates: NOT DETECTED
Benzodiazepines: NOT DETECTED
Cocaine: NOT DETECTED
OPIATES: NOT DETECTED
Tetrahydrocannabinol: NOT DETECTED

## 2014-01-08 LAB — URINALYSIS, ROUTINE W REFLEX MICROSCOPIC
Bilirubin Urine: NEGATIVE
GLUCOSE, UA: NEGATIVE mg/dL
Hgb urine dipstick: NEGATIVE
Ketones, ur: NEGATIVE mg/dL
Nitrite: NEGATIVE
Protein, ur: NEGATIVE mg/dL
Specific Gravity, Urine: 1.014 (ref 1.005–1.030)
UROBILINOGEN UA: 1 mg/dL (ref 0.0–1.0)
pH: 6 (ref 5.0–8.0)

## 2014-01-08 LAB — CBC
HEMATOCRIT: 39.9 % (ref 39.0–52.0)
Hemoglobin: 13.1 g/dL (ref 13.0–17.0)
MCH: 28.9 pg (ref 26.0–34.0)
MCHC: 32.8 g/dL (ref 30.0–36.0)
MCV: 87.9 fL (ref 78.0–100.0)
Platelets: 345 10*3/uL (ref 150–400)
RBC: 4.54 MIL/uL (ref 4.22–5.81)
RDW: 13.3 % (ref 11.5–15.5)
WBC: 6.3 10*3/uL (ref 4.0–10.5)

## 2014-01-08 LAB — PHENYTOIN LEVEL, TOTAL: Phenytoin Lvl: 25.4 ug/mL — ABNORMAL HIGH (ref 10.0–20.0)

## 2014-01-08 LAB — ETHANOL

## 2014-01-08 LAB — GLUCOSE, CAPILLARY: GLUCOSE-CAPILLARY: 70 mg/dL (ref 70–99)

## 2014-01-08 LAB — SALICYLATE LEVEL: Salicylate Lvl: <2 mg/dL — ABNORMAL LOW (ref 2.8–20.0)

## 2014-01-08 LAB — URINE MICROSCOPIC-ADD ON

## 2014-01-08 LAB — ACETAMINOPHEN LEVEL: Acetaminophen (Tylenol), Serum: <15 ug/mL (ref 10–30)

## 2014-01-08 NOTE — ED Notes (Addendum)
Pt sent from PCP for psych eval. Pt states that he is going through a divorce and the "papers" have things about pt behavior that PCP does not believe. PCP wants psychiatric eval. Pt is A&O and in NAD. Pt denies wanting to harm self or others.

## 2014-01-08 NOTE — BH Assessment (Signed)
[facility] Assessment Progress Note   At 18:11 I spoke to EDP Rolland Porter, MD in anticipation of TTS assessment scheduled for 18:20.  Jalene Mullet, MA Triage Specialist 01/08/14 @ 18:14
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
## 2014-01-08 NOTE — Discharge Instructions (Signed)
Recheck as needed °

## 2014-01-08 NOTE — ED Provider Notes (Signed)
CSN: [id number]     Arrival date & time 01/08/14  1603 History   First MD Initiated Contact with Patient 01/08/14 1701     Chief Complaint  Patient presents with  . Psychiatric Evaluation   (Consider location/radiation/quality/duration/timing/severity/associated sxs/prior Treatment) HPI The patient presents with a prescription from his primary care doctor stating he needs a psychiatric evaluation to "show his thinking is normal". Patient reports she's been living with a woman for about 7-8 years. He reports he has a court date next week because they are separating. He states they are not married. It seems that she has filled out a restraining order against him. He states they do have arguments that time however there is never any physical contact. He states he mainly argue about money. He reports she runs up credit card debt and he has helped her twice to get out of debt,, the first time $10,000 in cash, the second time he took out of 12,000 all along. He also states he gives her $1000 a month for household expenses. He states he has a gun however he has a permit to have that done. He has also taken her shooting before. He states he's never threatened her for gone. He states she claims she wakes her up all hours of the night. He states he woke her up once because he was having sciatic nerve pain in his left hip and asked her to give him a pain pill. He states he moved out and she is threatening to throw all of his belongings out which he has some expensive clothing and valuable guitars and amps. He reports he has never been admitted to psychiatric hospital, he's never seen a psychiatrist, he's never had a psychiatric diagnosis such as bipolar or schizophrenia, and he has never been on any psychiatric medications. His doctor also send him with a prescription to get a lawyer.  PCP Dr. Philip Aspen  Past Medical History  Diagnosis Date  . Diabetes mellitus without complication   . Stroke   . TIA (transient  ischemic attack)   . Hypertension    Past Surgical History  Procedure Laterality Date  . Knee surgery    . Tonsillectomy     No family history on file. History  Substance Use Topics  . Smoking status: Current Every Day Smoker    Types: Cigars  . Smokeless tobacco: Not on file  . Alcohol Use: No  pt is retired He does not smoke  Review of Systems  All other systems reviewed and are negative.    Allergies  Ace inhibitors and Ciprofloxacin  Home Medications   Current Outpatient Rx  Name  Route  Sig  Dispense  Refill  . ALPRAZolam (XANAX) 0.5 MG tablet   Oral   Take 0.25-0.5 mg by mouth. Up to three times per day as needed for anxiety         . amLODipine (NORVASC) 10 MG tablet   Oral   Take 10 mg by mouth daily.         . bimatoprost (LUMIGAN) 0.01 % SOLN   Both Ears   Place 1 drop into both ears at bedtime.         . cloNIDine (CATAPRES) 0.2 MG tablet   Oral   Take 0.2 mg by mouth 3 (three) times daily.         . diazepam (VALIUM) 10 MG tablet   Oral   Take 10 mg by mouth daily as needed (muscle spasms).         Marland Kitchen  gemfibrozil (LOPID) 600 MG tablet   Oral   Take 600 mg by mouth 2 (two) times daily.         . hydrochlorothiazide (HYDRODIURIL) 25 MG tablet   Oral   Take 25 mg by mouth daily.         Marland Kitchen HYDROcodone-acetaminophen (NORCO/VICODIN) 5-325 MG per tablet   Oral   Take 1-2 tablets by mouth every 8 (eight) hours as needed for pain.         Marland Kitchen labetalol (NORMODYNE) 300 MG tablet   Oral   Take 300 mg by mouth 2 (two) times daily.         . meloxicam (MOBIC) 15 MG tablet   Oral   Take 15 mg by mouth daily as needed for pain.         . metFORMIN (GLUMETZA) 500 MG (MOD) 24 hr tablet   Oral   Take 500 mg by mouth daily with supper.         . phenytoin (DILANTIN) 100 MG ER capsule   Oral   Take 200 mg by mouth 2 (two) times daily.         . traMADol (ULTRAM) 50 MG tablet   Oral   Take 1 tablet (50 mg total) by mouth  every 6 (six) hours as needed for pain.   15 tablet   0    BP 166/92  Pulse 84  Temp(Src) 98.5 F (36.9 C) (Oral)  Resp 16  SpO2 98%  Vital signs normal   Physical Exam  Nursing note and vitals reviewed. Constitutional: He is oriented to person, place, and time. He appears well-developed and well-nourished.  Non-toxic appearance. He does not appear ill. No distress.  HENT:  Head: Normocephalic and atraumatic.  Right Ear: External ear normal.  Left Ear: External ear normal.  Nose: Nose normal. No mucosal edema or rhinorrhea.  Mouth/Throat: Oropharynx is clear and moist and mucous membranes are normal. No dental abscesses or uvula swelling.  Eyes: Conjunctivae and EOM are normal. Pupils are equal, round, and reactive to light.  Neck: Normal range of motion and full passive range of motion without pain. Neck supple.  Cardiovascular: Normal rate, regular rhythm and normal heart sounds.  Exam reveals no gallop and no friction rub.   No murmur heard. Pulmonary/Chest: Effort normal and breath sounds normal. No respiratory distress. He has no wheezes. He has no rhonchi. He has no rales. He exhibits no tenderness and no crepitus.  Abdominal: Soft. Normal appearance and bowel sounds are normal. He exhibits no distension. There is no tenderness. There is no rebound and no guarding.  Musculoskeletal: Normal range of motion. He exhibits no edema and no tenderness.  Moves all extremities well.   Neurological: He is alert and oriented to person, place, and time. He has normal strength. No cranial nerve deficit.  Skin: Skin is warm, dry and intact. No rash noted. No erythema. No pallor.  Psychiatric: He has a normal mood and affect. His speech is normal and behavior is normal. Judgment and thought content normal. His mood appears not anxious. His affect is not angry, not blunt, not labile and not inappropriate. Cognition and memory are normal. He does not exhibit a depressed mood.    ED Course   Procedures (including critical care time)   18:15 Tom, TSS called to get reason for consult.   19:38 Pt has been assessed by TSS, Tom who feels patient does not need any further psychiatric evaluation.  Labs Review Results for orders placed during the hospital encounter of 01/08/14  ACETAMINOPHEN LEVEL      Result Value Range   Acetaminophen (Tylenol), Serum <15.0  10 - 30 ug/mL  CBC      Result Value Range   WBC 6.3  4.0 - 10.5 K/uL   RBC 4.54  4.22 - 5.81 MIL/uL   Hemoglobin 13.1  13.0 - 17.0 g/dL   HCT 39.9  39.0 - 52.0 %   MCV 87.9  78.0 - 100.0 fL   MCH 28.9  26.0 - 34.0 pg   MCHC 32.8  30.0 - 36.0 g/dL   RDW 13.3  11.5 - 15.5 %   Platelets 345  150 - 400 K/uL  COMPREHENSIVE METABOLIC PANEL      Result Value Range   Sodium 139  137 - 147 mEq/L   Potassium 3.6 (*) 3.7 - 5.3 mEq/L   Chloride 99  96 - 112 mEq/L   CO2 25  19 - 32 mEq/L   Glucose, Bld 97  70 - 99 mg/dL   BUN 9  6 - 23 mg/dL   Creatinine, Ser 0.77  0.50 - 1.35 mg/dL   Calcium 9.7  8.4 - 10.5 mg/dL   Total Protein 7.9  6.0 - 8.3 g/dL   Albumin 4.5  3.5 - 5.2 g/dL   AST 17  0 - 37 U/L   ALT 7  0 - 53 U/L   Alkaline Phosphatase 91  39 - 117 U/L   Total Bilirubin 0.3  0.3 - 1.2 mg/dL   GFR calc non Af Amer 90 (*) >90 mL/min   GFR calc Af Amer >90  >90 mL/min  ETHANOL      Result Value Range   Alcohol, Ethyl (B) <11  0 - 11 mg/dL  SALICYLATE LEVEL      Result Value Range   Salicylate Lvl <3.7 (*) 2.8 - 20.0 mg/dL  URINE RAPID DRUG SCREEN (HOSP PERFORMED)      Result Value Range   Opiates NONE DETECTED  NONE DETECTED   Cocaine NONE DETECTED  NONE DETECTED   Benzodiazepines NONE DETECTED  NONE DETECTED   Amphetamines NONE DETECTED  NONE DETECTED   Tetrahydrocannabinol NONE DETECTED  NONE DETECTED   Barbiturates NONE DETECTED  NONE DETECTED  URINALYSIS, ROUTINE W REFLEX MICROSCOPIC      Result Value Range   Color, Urine YELLOW  YELLOW   APPearance CLEAR  CLEAR   Specific Gravity, Urine 1.014   1.005 - 1.030   pH 6.0  5.0 - 8.0   Glucose, UA NEGATIVE  NEGATIVE mg/dL   Hgb urine dipstick NEGATIVE  NEGATIVE   Bilirubin Urine NEGATIVE  NEGATIVE   Ketones, ur NEGATIVE  NEGATIVE mg/dL   Protein, ur NEGATIVE  NEGATIVE mg/dL   Urobilinogen, UA 1.0  0.0 - 1.0 mg/dL   Nitrite NEGATIVE  NEGATIVE   Leukocytes, UA TRACE (*) NEGATIVE  GLUCOSE, CAPILLARY      Result Value Range   Glucose-Capillary 70  70 - 99 mg/dL  URINE MICROSCOPIC-ADD ON      Result Value Range   Squamous Epithelial / LPF RARE  RARE   WBC, UA 0-2  <3 WBC/hpf   Laboratory interpretation all normal except mild hypokalemia    Imaging Review No results found.  EKG Interpretation   None       MDM   1. Normal physical exam     Plan discharge  Rolland Porter, MD, Abram Sander  Janice Norrie, MD 01/08/14 [phone]

## 2014-04-09 ENCOUNTER — Ambulatory Visit (INDEPENDENT_AMBULATORY_CARE_PROVIDER_SITE_OTHER): Payer: Medicare Other | Admitting: Podiatrist

## 2014-04-09 ENCOUNTER — Encounter: Payer: Self-pay | Admitting: Podiatrist

## 2014-04-09 VITALS — BP 173/101 | HR 80 | Resp 18

## 2014-04-09 DIAGNOSIS — M79609 Pain in unspecified limb: Secondary | ICD-10-CM

## 2014-04-09 DIAGNOSIS — B351 Tinea unguium: Secondary | ICD-10-CM

## 2014-04-13 NOTE — Progress Notes (Signed)
HPI:  Patient presents today for follow up of foot and nail care. Denies any new complaints today.  Objective:  Patients chart is reviewed.  Vascular status reveals pedal pulses noted at 1 out of 4 dp and pt bilateral .  Neurological sensation is Normal to Semmes Weinstein monofilament bilateral.  Patients nails are thickened, discolored, distrophic, friable and brittle with yellow-brown discoloration. Patient subjectively relates they are painful with shoes and with ambulation of bilateral feet.  Assessment:  Symptomatic onychomycosis  Plan:  Discussed treatment options and alternatives.  The symptomatic toenails were debrided through manual an mechanical means without complication.  Return appointment recommended at routine intervals of 3 months    Achol Azpeitia, DPM   

## 2014-08-06 ENCOUNTER — Emergency Department (HOSPITAL_COMMUNITY): Payer: Medicare Other

## 2014-08-06 ENCOUNTER — Emergency Department (HOSPITAL_COMMUNITY)
Admission: EM | Admit: 2014-08-06 | Discharge: 2014-08-06 | Disposition: A | Discharge status: Home / Self Care | Payer: Medicare Other | Attending: Emergency Medicine | Admitting: Emergency Medicine

## 2014-08-06 ENCOUNTER — Encounter (HOSPITAL_COMMUNITY): Payer: Self-pay | Admitting: Emergency Medicine

## 2014-08-06 DIAGNOSIS — R5383 Other fatigue: Secondary | ICD-10-CM

## 2014-08-06 DIAGNOSIS — I10 Essential (primary) hypertension: Secondary | ICD-10-CM | POA: Diagnosis not present

## 2014-08-06 DIAGNOSIS — G40909 Epilepsy, unspecified, not intractable, without status epilepticus: Secondary | ICD-10-CM | POA: Insufficient documentation

## 2014-08-06 DIAGNOSIS — R062 Wheezing: Secondary | ICD-10-CM | POA: Insufficient documentation

## 2014-08-06 DIAGNOSIS — Z79899 Other long term (current) drug therapy: Secondary | ICD-10-CM | POA: Insufficient documentation

## 2014-08-06 DIAGNOSIS — R5381 Other malaise: Secondary | ICD-10-CM | POA: Insufficient documentation

## 2014-08-06 DIAGNOSIS — E119 Type 2 diabetes mellitus without complications: Secondary | ICD-10-CM | POA: Insufficient documentation

## 2014-08-06 DIAGNOSIS — R42 Dizziness and giddiness: Secondary | ICD-10-CM | POA: Diagnosis not present

## 2014-08-06 DIAGNOSIS — Z8673 Personal history of transient ischemic attack (TIA), and cerebral infarction without residual deficits: Secondary | ICD-10-CM | POA: Insufficient documentation

## 2014-08-06 DIAGNOSIS — Z87891 Personal history of nicotine dependence: Secondary | ICD-10-CM | POA: Diagnosis not present

## 2014-08-06 LAB — CBC
HEMATOCRIT: 40.6 % (ref 39.0–52.0)
HEMOGLOBIN: 13.3 g/dL (ref 13.0–17.0)
MCH: 28.4 pg (ref 26.0–34.0)
MCHC: 32.8 g/dL (ref 30.0–36.0)
MCV: 86.6 fL (ref 78.0–100.0)
Platelets: 327 10*3/uL (ref 150–400)
RBC: 4.69 MIL/uL (ref 4.22–5.81)
RDW: 13 % (ref 11.5–15.5)
WBC: 4 10*3/uL (ref 4.0–10.5)

## 2014-08-06 LAB — URINALYSIS, ROUTINE W REFLEX MICROSCOPIC
Bilirubin Urine: NEGATIVE
Glucose, UA: NEGATIVE mg/dL
HGB URINE DIPSTICK: NEGATIVE
Ketones, ur: NEGATIVE mg/dL
Leukocytes, UA: NEGATIVE
NITRITE: NEGATIVE
PROTEIN: 30 mg/dL — AB
SPECIFIC GRAVITY, URINE: 1.017 (ref 1.005–1.030)
UROBILINOGEN UA: 1 mg/dL (ref 0.0–1.0)
pH: 7 (ref 5.0–8.0)

## 2014-08-06 LAB — I-STAT CHEM 8, ED
BUN: 8 mg/dL (ref 6–23)
CALCIUM ION: 1.1 mmol/L — AB (ref 1.13–1.30)
Chloride: 103 mEq/L (ref 96–112)
Creatinine, Ser: 0.7 mg/dL (ref 0.50–1.35)
GLUCOSE: 120 mg/dL — AB (ref 70–99)
HCT: 46 % (ref 39.0–52.0)
Hemoglobin: 15.6 g/dL (ref 13.0–17.0)
Potassium: 4.1 mEq/L (ref 3.7–5.3)
Sodium: 139 mEq/L (ref 137–147)
TCO2: 25 mmol/L (ref 0–100)

## 2014-08-06 LAB — URINE MICROSCOPIC-ADD ON

## 2014-08-06 LAB — COMPREHENSIVE METABOLIC PANEL
ALT: 9 U/L (ref 0–53)
ANION GAP: 14 (ref 5–15)
AST: 19 U/L (ref 0–37)
Albumin: 4 g/dL (ref 3.5–5.2)
Alkaline Phosphatase: 102 U/L (ref 39–117)
BUN: 9 mg/dL (ref 6–23)
CO2: 24 meq/L (ref 19–32)
CREATININE: 0.63 mg/dL (ref 0.50–1.35)
Calcium: 9.4 mg/dL (ref 8.4–10.5)
Chloride: 99 mEq/L (ref 96–112)
GFR calc Af Amer: >90 mL/min (ref >90)
GFR calc non Af Amer: >90 mL/min (ref >90)
Glucose, Bld: 122 mg/dL — ABNORMAL HIGH (ref 70–99)
Potassium: 4.5 mEq/L (ref 3.7–5.3)
SODIUM: 137 meq/L (ref 137–147)
TOTAL PROTEIN: 7.4 g/dL (ref 6.0–8.3)
Total Bilirubin: <0.2 mg/dL — ABNORMAL LOW (ref 0.3–1.2)

## 2014-08-06 LAB — CBG MONITORING, ED: GLUCOSE-CAPILLARY: 119 mg/dL — AB (ref 70–99)

## 2014-08-06 LAB — TROPONIN I

## 2014-08-06 MED ORDER — ASPIRIN EC 325 MG PO TBEC: 325.0000 mg | DELAYED_RELEASE_TABLET | Freq: Every day | ORAL | Status: DC

## 2014-08-06 NOTE — Discharge Instructions (Signed)

## 2014-08-06 NOTE — ED Provider Notes (Signed)
CSN: [id number]     Arrival date & time 08/06/14  1157 History   First MD Initiated Contact with Patient 08/06/14 1240     Chief Complaint  Patient presents with  . Weakness  . Dizziness     (Consider location/radiation/quality/duration/timing/severity/associated sxs/prior Treatment) HPI Comments: 73 yo male with 3 days of gradual onset fatigue.  He is a difficult/vague historian.  He reports generalized malaise, worsening over three days.  He has had some indigestion and "gas".  He denies chest pain, SOB, abdominal pain, vomiting, diarrhea, or fevers.  He has had to occasionally hold onto stationary objects when walking, but denies room spinning, self spinning, or rocking boat sensation.  Nothing seems to make his symptoms worse.  He came to the ED because he told his son his symptoms, who subsequently advised he come to the ED.       Past Medical History  Diagnosis Date  . Diabetes mellitus without complication   . Stroke   . TIA (transient ischemic attack)   . Hypertension   . Seizures    Past Surgical History  Procedure Laterality Date  . Knee surgery    . Tonsillectomy     History reviewed. No pertinent family history. History  Substance Use Topics  . Smoking status: Former Smoker    Types: Cigars  . Smokeless tobacco: Never Used  . Alcohol Use: No    Review of Systems  Neurological: Positive for dizziness and weakness.  All other systems reviewed and are negative.     Allergies  Clindamycin/lincomycin; Ace inhibitors; and Ciprofloxacin  Home Medications   Prior to Admission medications   Medication Sig Start Date End Date Taking? Authorizing Provider  amLODipine (NORVASC) 5 MG tablet Take 5 mg by mouth daily.   Yes Historical Provider, MD  bimatoprost (LUMIGAN) 0.01 % SOLN Place 1 drop into both ears at bedtime.   Yes Historical Provider, MD  Brinzolamide-Brimonidine 1-0.2 % SUSP Apply 1 drop to eye 2 (two) times daily.   Yes Historical Provider, MD   cloNIDine (CATAPRES) 0.2 MG tablet Take 0.2 mg by mouth 3 (three) times daily.   Yes Historical Provider, MD  diazepam (VALIUM) 10 MG tablet Take 10 mg by mouth daily as needed (muscle spasms).   Yes Historical Provider, MD  gemfibrozil (LOPID) 600 MG tablet Take 600 mg by mouth 2 (two) times daily.   Yes Historical Provider, MD  hydrochlorothiazide (HYDRODIURIL) 25 MG tablet Take 25 mg by mouth daily.   Yes Historical Provider, MD  meloxicam (MOBIC) 15 MG tablet Take 15 mg by mouth daily as needed for pain.   Yes Historical Provider, MD  metFORMIN (GLUMETZA) 500 MG (MOD) 24 hr tablet Take 500 mg by mouth daily with supper.   Yes Historical Provider, MD  phenytoin (DILANTIN) 100 MG ER capsule Take 200 mg by mouth 2 (two) times daily.   Yes Historical Provider, MD   BP 153/97  Pulse 105  Temp(Src) 99.3 F (37.4 C) (Oral)  Resp 18  Ht 6\' 1"  (1.854 m)  Wt 202 lb (91.627 kg)  BMI 26.66 kg/m2  SpO2 100% Physical Exam  Nursing note and vitals reviewed. Constitutional: He is oriented to person, place, and time. He appears well-developed and well-nourished. No distress.  HENT:  Head: Normocephalic and atraumatic.  Mouth/Throat: Oropharynx is clear and moist.  Eyes: Conjunctivae are normal. Pupils are equal, round, and reactive to light. No scleral icterus.  Neck: Neck supple.  Cardiovascular: Normal rate, regular rhythm, normal  heart sounds and intact distal pulses.   No murmur heard. Pulmonary/Chest: Effort normal and breath sounds normal. No stridor. No respiratory distress. He has no wheezes. He has no rales.  Abdominal: Soft. He exhibits no distension. There is no tenderness.  Musculoskeletal: Normal range of motion. He exhibits no edema.  Neurological: He is alert and oriented to person, place, and time. He has normal strength. No cranial nerve deficit or sensory deficit. Gait (able to walk without assistance, appeared slightly off balance, but no stumbles or misteps.  ) abnormal.  Coordination normal.  Skin: Skin is warm and dry. No rash noted.  Psychiatric: He has a normal mood and affect. His behavior is normal.    ED Course  Procedures (including critical care time) Labs Review Labs Reviewed  COMPREHENSIVE METABOLIC PANEL - Abnormal; Notable for the following:    Glucose, Bld 122 (*)    Total Bilirubin <0.2 (*)    All other components within normal limits  URINALYSIS, ROUTINE W REFLEX MICROSCOPIC - Abnormal; Notable for the following:    Protein, ur 30 (*)    All other components within normal limits  URINE MICROSCOPIC-ADD ON - Abnormal; Notable for the following:    Casts HYALINE CASTS (*)    All other components within normal limits  CBG MONITORING, ED - Abnormal; Notable for the following:    Glucose-Capillary 119 (*)    All other components within normal limits  I-STAT CHEM 8, ED - Abnormal; Notable for the following:    Glucose, Bld 120 (*)    Calcium, Ion 1.10 (*)    All other components within normal limits  CBC  TROPONIN I    Imaging Review Dg Chest 2 View  08/06/2014   CLINICAL DATA:  Weakness.  Hypertension.  Diabetes.  EXAM: CHEST  2 VIEW  COMPARISON:  05/31/2013.  FINDINGS: Mediastinum and hilar structures are normal. The lungs are clear. Mild cardiomegaly. Normal pulmonary vascularity. Stable elevation left hemidiaphragm. No pleural effusion or pneumothorax. Degenerative changes thoracic spine.  IMPRESSION: 1. No acute cardiopulmonary disease. 2. Stable cardiomegaly.   Electronically Signed   By: Marcello Moores  Register   On: 08/06/2014 14:37   Ct Head Wo Contrast  08/06/2014   CLINICAL DATA:  Dizziness and weakness with some speech difficulty ; history of mini strokes  EXAM: CT HEAD WITHOUT CONTRAST  TECHNIQUE: Contiguous axial images were obtained from the base of the skull through the vertex without intravenous contrast.  COMPARISON:  None.  FINDINGS: There is a large area of encephalomalacia in the right parietal lobe. There is no shift of the  midline. The ventricles are normal in size. There is no acute intracranial hemorrhage nor evidence of acute ischemic change. The cerebellum and brainstem are normal.  The observed paranasal sinuses and mastoid air cells are clear. There is no skull fracture.  IMPRESSION: 1. No acute intracranial hemorrhage nor acute ischemic event is demonstrated. 2. 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   Electronically Signed   By: David  Martinique   On: 08/06/2014 14:31  All radiology studies independently viewed by me.      EKG Interpretation   Date/Time:  Friday August 06 2014 12:07:10 EDT Ventricular Rate:  105 PR Interval:  171 QRS Duration: 79 QT Interval:  336 QTC Calculation: 444 R Axis:   25 Text Interpretation:  Sinus tachycardia Nonspecific T abnormalities,  lateral leads No significant change was found Confirmed by [facility]  MD,  TREY ([id number])  on 08/06/2014 3:32:34 PM      MDM   Final diagnoses:  Other fatigue  Dizziness    73 yo male presenting with multiple nonspecific symptoms including generalized fatigue, generalized but nonfocal weakness, "gas", left arm intermittent tingling, and intermittent balance disturbance.  Initial exam notable only for very mild ataxia.  No other neuro or other PE findings.  ED lab and imaging workup negative.  EKG unchanged.  I discussed with him the finding of a prior CVA and he reported having a stroke in the past, around the time that his mother died.  On re-evaluation, pt appeared much less fatigued and had a normal repeat gait exam.  He stated he felt much better.  I discussed with him the option to get an MRI of his brain to rule out stroke, but he declined this test.  I spoke with his PCP, Dr. Philip Aspen, via telephone.  He agreed to see Mr. Paterson closely in follow up and recommended starting 325mg  daily aspirin.  As his symptoms and (subtle at worst) exam findings resolved without intervention, I think it is  reasonable to pursue an outpatient workup for his symptoms.  However, he was given good return precautions and will follow up closely with PCP.      Houston Siren III, MD 08/06/14 [phone]

## 2014-08-06 NOTE — ED Notes (Signed)
Per pt, started having dizziness increased weakness yesterday or day before.  Pt states left arm is tingling.  Pt is hesitant in his speech.  Family states more so than normal.  Grips equal.  Gait is slow

## 2014-08-11 ENCOUNTER — Other Ambulatory Visit: Payer: Self-pay | Admitting: Internal Medicine

## 2014-08-11 DIAGNOSIS — R2681 Unsteadiness on feet: Secondary | ICD-10-CM

## 2014-08-14 ENCOUNTER — Ambulatory Visit
Admission: RE | Admit: 2014-08-14 | Discharge: 2014-08-14 | Disposition: A | Discharge status: Home / Self Care | Payer: Medicare Other | Source: Ambulatory Visit | Attending: Internal Medicine | Admitting: Internal Medicine

## 2014-08-14 DIAGNOSIS — R2681 Unsteadiness on feet: Secondary | ICD-10-CM

## 2014-10-27 ENCOUNTER — Other Ambulatory Visit: Payer: Self-pay | Admitting: Urology

## 2014-10-27 DIAGNOSIS — C61 Malignant neoplasm of prostate: Secondary | ICD-10-CM

## 2014-11-22 ENCOUNTER — Emergency Department (HOSPITAL_COMMUNITY)
Admission: EM | Admit: 2014-11-22 | Discharge: 2014-11-22 | Disposition: A | Discharge status: Home / Self Care | Payer: Medicare Other | Source: Home / Self Care | Attending: Family Medicine | Admitting: Family Medicine

## 2014-11-22 ENCOUNTER — Encounter (HOSPITAL_COMMUNITY): Payer: Self-pay | Admitting: Emergency Medicine

## 2014-11-22 DIAGNOSIS — J069 Acute upper respiratory infection, unspecified: Secondary | ICD-10-CM

## 2014-11-22 DIAGNOSIS — B9789 Other viral agents as the cause of diseases classified elsewhere: Principal | ICD-10-CM

## 2014-11-22 MED ORDER — IPRATROPIUM BROMIDE 0.06 % NA SOLN: 2.0000 | NASAL | Status: DC | PRN

## 2014-11-22 MED ORDER — GUAIFENESIN-CODEINE 100-10 MG/5ML PO SOLN: 10.0000 mL | ORAL | Status: DC | PRN

## 2014-11-22 NOTE — ED Notes (Signed)
Pt started with symptoms about four days ago.  He has had a cough, chest and nasal congestion, sore throat and a low grade fever.  He is also hoarse.

## 2014-11-22 NOTE — Discharge Instructions (Signed)
Upper Respiratory Infection, Adult An upper respiratory infection (URI) is also sometimes known as the common cold. The upper respiratory tract includes the nose, sinuses, throat, trachea, and bronchi. Bronchi are the airways leading to the lungs. Most people improve within 1 week, but symptoms can last up to 2 weeks. A residual cough may last even longer.  CAUSES Many different viruses can infect the tissues lining the upper respiratory tract. The tissues become irritated and inflamed and often become very moist. Mucus production is also common. A cold is contagious. You can easily spread the virus to others by oral contact. This includes kissing, sharing a glass, coughing, or sneezing. Touching your mouth or nose and then touching a surface, which is then touched by another person, can also spread the virus. SYMPTOMS  Symptoms typically develop 1 to 3 days after you come in contact with a cold virus. Symptoms vary from person to person. They may include:  Runny nose.  Sneezing.  Nasal congestion.  Sinus irritation.  Sore throat.  Loss of voice (laryngitis).  Cough.  Fatigue.  Muscle aches.  Loss of appetite.  Headache.  Low-grade fever. DIAGNOSIS  You might diagnose your own cold based on familiar symptoms, since most people get a cold 2 to 3 times a year. Your caregiver can confirm this based on your exam. Most importantly, your caregiver can check that your symptoms are not due to another disease such as strep throat, sinusitis, pneumonia, asthma, or epiglottitis. Blood tests, throat tests, and X-rays are not necessary to diagnose a common cold, but they may sometimes be helpful in excluding other more serious diseases. Your caregiver will decide if any further tests are required. RISKS AND COMPLICATIONS  You may be at risk for a more severe case of the common cold if you smoke cigarettes, have chronic heart disease (such as heart failure) or lung disease (such as asthma), or if  you have a weakened immune system. The very young and very old are also at risk for more serious infections. Bacterial sinusitis, middle ear infections, and bacterial pneumonia can complicate the common cold. The common cold can worsen asthma and chronic obstructive pulmonary disease (COPD). Sometimes, these complications can require emergency medical care and may be life-threatening. PREVENTION  The best way to protect against getting a cold is to practice good hygiene. Avoid oral or hand contact with people with cold symptoms. Wash your hands often if contact occurs. There is no clear evidence that vitamin C, vitamin E, echinacea, or exercise reduces the chance of developing a cold. However, it is always recommended to get plenty of rest and practice good nutrition. TREATMENT  Treatment is directed at relieving symptoms. There is no cure. Antibiotics are not effective, because the infection is caused by a virus, not by bacteria. Treatment may include:  Increased fluid intake. Sports drinks offer valuable electrolytes, sugars, and fluids.  Breathing heated mist or steam (vaporizer or shower).  Eating chicken soup or other clear broths, and maintaining good nutrition.  Getting plenty of rest.  Using gargles or lozenges for comfort.  Controlling fevers with ibuprofen or acetaminophen as directed by your caregiver.  Increasing usage of your inhaler if you have asthma. Zinc gel and zinc lozenges, taken in the first 24 hours of the common cold, can shorten the duration and lessen the severity of symptoms. Pain medicines may help with fever, muscle aches, and throat pain. A variety of non-prescription medicines are available to treat congestion and runny nose. Your caregiver   can make recommendations and may suggest nasal or lung inhalers for other symptoms.  HOME CARE INSTRUCTIONS   Only take over-the-counter or prescription medicines for pain, discomfort, or fever as directed by your  caregiver.  Use a warm mist humidifier or inhale steam from a shower to increase air moisture. This may keep secretions moist and make it easier to breathe.  Drink enough water and fluids to keep your urine clear or pale yellow.  Rest as needed.  Return to work when your temperature has returned to normal or as your caregiver advises. You may need to stay home longer to avoid infecting others. You can also use a face mask and careful hand washing to prevent spread of the virus. SEEK MEDICAL CARE IF:   After the first few days, you feel you are getting worse rather than better.  You need your caregiver's advice about medicines to control symptoms.  You develop chills, worsening shortness of breath, or brown or red sputum. These may be signs of pneumonia.  You develop yellow or brown nasal discharge or pain in the face, especially when you bend forward. These may be signs of sinusitis.  You develop a fever, swollen neck glands, pain with swallowing, or white areas in the back of your throat. These may be signs of strep throat. SEEK IMMEDIATE MEDICAL CARE IF:   You have a fever.  You develop severe or persistent headache, ear pain, sinus pain, or chest pain.  You develop wheezing, a prolonged cough, cough up blood, or have a change in your usual mucus (if you have chronic lung disease).  You develop sore muscles or a stiff neck. Document Released: 05/22/2001 Document Revised: 02/18/2012 Document Reviewed: 03/03/2014 [employer] Patient Information 2015 [employer]. This information is not intended to replace advice given to you by your health care provider. Make sure you discuss any questions you have with your health care provider.  

## 2014-11-22 NOTE — ED Provider Notes (Signed)
CSN: [id number]     Arrival date & time 11/22/14  1237 History   First MD Initiated Contact with Patient 11/22/14 1404     Chief Complaint  Patient presents with  . Cough  . Sore Throat  . Nasal Congestion    & chest congestion   (Consider location/radiation/quality/duration/timing/severity/associated sxs/prior Treatment) HPI       73 year old male presents for evaluation of being sick for 4 days. He has cough, congestion, sore throat, rhinorrhea. His symptoms were worse at the beginning, they have gradually gotten better. He also had a fever 101F when this first began but the fever has resolved. He feels like his sore throat has gotten a lot better as well. No nausea or vomiting. No chest pain or shortness of breath. No recent travel or sick contacts.  Past Medical History  Diagnosis Date  . Diabetes mellitus without complication   . Stroke   . TIA (transient ischemic attack)   . Hypertension   . Seizures    Past Surgical History  Procedure Laterality Date  . Knee surgery    . Tonsillectomy     History reviewed. No pertinent family history. History  Substance Use Topics  . Smoking status: Former Smoker    Types: Cigars  . Smokeless tobacco: Never Used  . Alcohol Use: No    Review of Systems  Constitutional: Positive for fever.  HENT: Positive for congestion, rhinorrhea and sore throat. Negative for ear pain, postnasal drip and sinus pressure.   Respiratory: Positive for cough. Negative for chest tightness and shortness of breath.   Cardiovascular: Negative for chest pain.  Gastrointestinal: Negative for nausea, vomiting, abdominal pain and diarrhea.  All other systems reviewed and are negative.   Allergies  Clindamycin/lincomycin; Ace inhibitors; and Ciprofloxacin  Home Medications   Prior to Admission medications   Medication Sig Start Date End Date Taking? Authorizing Provider  amLODipine (NORVASC) 5 MG tablet Take 5 mg by mouth daily.    Historical Provider,  MD  aspirin EC 325 MG tablet Take 1 tablet (325 mg total) by mouth daily. 08/06/14   Artis Delay, MD  bimatoprost (LUMIGAN) 0.01 % SOLN Place 1 drop into both ears at bedtime.    Historical Provider, MD  Brinzolamide-Brimonidine 1-0.2 % SUSP Apply 1 drop to eye 2 (two) times daily.    Historical Provider, MD  cloNIDine (CATAPRES) 0.2 MG tablet Take 0.2 mg by mouth 3 (three) times daily.    Historical Provider, MD  diazepam (VALIUM) 10 MG tablet Take 10 mg by mouth daily as needed (muscle spasms).    Historical Provider, MD  gemfibrozil (LOPID) 600 MG tablet Take 600 mg by mouth 2 (two) times daily.    Historical Provider, MD  guaiFENesin-codeine 100-10 MG/5ML syrup Take 10 mLs by mouth every 4 (four) hours as needed for cough. 11/22/14   Liam Graham, PA-C  hydrochlorothiazide (HYDRODIURIL) 25 MG tablet Take 25 mg by mouth daily.    Historical Provider, MD  ipratropium (ATROVENT) 0.06 % nasal spray Place 2 sprays into both nostrils every 4 (four) hours as needed for rhinitis. 11/22/14   Liam Graham, PA-C  meloxicam (MOBIC) 15 MG tablet Take 15 mg by mouth daily as needed for pain.    Historical Provider, MD  metFORMIN (GLUMETZA) 500 MG (MOD) 24 hr tablet Take 500 mg by mouth daily with supper.    Historical Provider, MD  phenytoin (DILANTIN) 100 MG ER capsule Take 200 mg by mouth 2 (two) times daily.  Historical Provider, MD   BP 169/97 mmHg  Pulse 94  Temp(Src) 97.9 F (36.6 C) (Oral)  Resp 16  SpO2 97% Physical Exam  Constitutional: He is oriented to person, place, and time. He appears well-developed and well-nourished. No distress.  HENT:  Head: Normocephalic and atraumatic.  Right Ear: Tympanic membrane, external ear and ear canal normal.  Left Ear: Tympanic membrane, external ear and ear canal normal.  Nose: Rhinorrhea present. Right sinus exhibits no maxillary sinus tenderness and no frontal sinus tenderness. Left sinus exhibits no maxillary sinus tenderness and no frontal  sinus tenderness.  Mouth/Throat: Uvula is midline, oropharynx is clear and moist and mucous membranes are normal. No oropharyngeal exudate or posterior oropharyngeal erythema.  Neck: Normal range of motion. Neck supple.  Cardiovascular: Normal rate, regular rhythm and normal heart sounds.   Pulmonary/Chest: Effort normal and breath sounds normal. No respiratory distress.  Lymphadenopathy:    He has no cervical adenopathy.  Neurological: He is alert and oriented to person, place, and time. Coordination normal.  Skin: Skin is warm and dry. No rash noted. He is not diaphoretic.  Psychiatric: He has a normal mood and affect. Judgment normal.  Nursing note and vitals reviewed.   ED Course  Procedures (including critical care time) Labs Review Labs Reviewed - No data to display  Imaging Review No results found.   MDM   1. Viral URI with cough    He is improving with over-the-counter medication, he should continue to improve. Will prescribe codeine cough medicine for cough and Atrovent nasal spray for rhinorrhea. Return precautions discussed, follow-up when necessary  Meds ordered this encounter  Medications  . ipratropium (ATROVENT) 0.06 % nasal spray    Sig: Place 2 sprays into both nostrils every 4 (four) hours as needed for rhinitis.    Dispense:  15 mL    Refill:  1    Order Specific Question:  Supervising Provider    Answer:  Jake Michaelis, DAVID C [id number]  . guaiFENesin-codeine 100-10 MG/5ML syrup    Sig: Take 10 mLs by mouth every 4 (four) hours as needed for cough.    Dispense:  120 mL    Refill:  0    Order Specific Question:  Supervising Provider    Answer:  Jake Michaelis, DAVID C [[id number]]       Liam Graham, PA-C 11/22/14 [phone]

## 2014-11-29 ENCOUNTER — Ambulatory Visit (HOSPITAL_COMMUNITY)
Admission: RE | Admit: 2014-11-29 | Discharge: 2014-11-29 | Disposition: A | Discharge status: Home / Self Care | Payer: Medicare Other | Source: Ambulatory Visit | Attending: Urology | Admitting: Urology

## 2014-11-29 DIAGNOSIS — C61 Malignant neoplasm of prostate: Secondary | ICD-10-CM | POA: Diagnosis not present

## 2014-11-29 LAB — POCT I-STAT CREATININE: Creatinine, Ser: 0.7 mg/dL (ref 0.50–1.35)

## 2014-11-29 MED ORDER — GADOBENATE DIMEGLUMINE 529 MG/ML IV SOLN
18.0000 mL | Freq: Once | INTRAVENOUS | Status: AC | PRN
  Administered 2014-11-29: 18 mL via INTRAVENOUS

## 2014-12-13 DIAGNOSIS — C61 Malignant neoplasm of prostate: Secondary | ICD-10-CM | POA: Diagnosis not present

## 2014-12-17 DIAGNOSIS — R972 Elevated prostate specific antigen [PSA]: Secondary | ICD-10-CM | POA: Diagnosis not present

## 2014-12-29 DIAGNOSIS — L989 Disorder of the skin and subcutaneous tissue, unspecified: Secondary | ICD-10-CM | POA: Diagnosis not present

## 2014-12-29 DIAGNOSIS — I10 Essential (primary) hypertension: Secondary | ICD-10-CM | POA: Diagnosis not present

## 2014-12-29 DIAGNOSIS — C61 Malignant neoplasm of prostate: Secondary | ICD-10-CM | POA: Diagnosis not present

## 2014-12-29 DIAGNOSIS — R7301 Impaired fasting glucose: Secondary | ICD-10-CM | POA: Diagnosis not present

## 2014-12-29 DIAGNOSIS — E1129 Type 2 diabetes mellitus with other diabetic kidney complication: Secondary | ICD-10-CM | POA: Diagnosis not present

## 2014-12-29 DIAGNOSIS — L600 Ingrowing nail: Secondary | ICD-10-CM | POA: Diagnosis not present

## 2014-12-29 DIAGNOSIS — E1121 Type 2 diabetes mellitus with diabetic nephropathy: Secondary | ICD-10-CM | POA: Diagnosis not present

## 2014-12-29 DIAGNOSIS — Z6826 Body mass index (BMI) 26.0-26.9, adult: Secondary | ICD-10-CM | POA: Diagnosis not present

## 2015-01-14 ENCOUNTER — Ambulatory Visit (INDEPENDENT_AMBULATORY_CARE_PROVIDER_SITE_OTHER): Payer: Commercial Managed Care - HMO

## 2015-01-14 ENCOUNTER — Encounter: Payer: Self-pay | Admitting: Podiatrist

## 2015-01-14 ENCOUNTER — Ambulatory Visit (INDEPENDENT_AMBULATORY_CARE_PROVIDER_SITE_OTHER): Payer: Commercial Managed Care - HMO | Admitting: Podiatrist

## 2015-01-14 VITALS — BP 107/77 | HR 100 | Resp 12

## 2015-01-14 DIAGNOSIS — L600 Ingrowing nail: Secondary | ICD-10-CM

## 2015-01-14 DIAGNOSIS — S93402S Sprain of unspecified ligament of left ankle, sequela: Secondary | ICD-10-CM | POA: Diagnosis not present

## 2015-01-14 DIAGNOSIS — R52 Pain, unspecified: Secondary | ICD-10-CM

## 2015-01-14 NOTE — Progress Notes (Signed)
   Subjective:    Patient ID: Anthony Hartshorn Sr., male    DOB: [birth year], 74 y.o.   MRN: [id number]  HPI PT STATED RT LATERAL SIDE OF THE ANKLE IS PAINFUL AND SWOLLEN FOR 3 YEARS. THE ANKLE IS GETTING WORSE AND GET AGGRAVATED PUTTING PRESSURE.  TRIED NO TREATMENT.  ALSO, LT 1ST,3RD, AND 5TH TOENAIL ARE PAINFUL.   Review of Systems  Musculoskeletal: Positive for gait problem.  All other systems reviewed and are negative.      Objective:   Physical Exam  Neurovascular status is unchanged. Toenails 3, 4 of the left foot were an AP nail procedure was performed looks slightly macerated due to the use of bandages however they do not appear infected. He also has some pain on the lateral ankle of the left foot as well and states that the ankle brace that was prescribed for the right ankle has been helping significantly. 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 He'll be seen back for recheck and if any concerns arise he'll call

## 2015-01-14 NOTE — Patient Instructions (Addendum)

## 2015-01-17 DIAGNOSIS — H4011X2 Primary open-angle glaucoma, moderate stage: Secondary | ICD-10-CM | POA: Diagnosis not present

## 2015-01-17 DIAGNOSIS — H2513 Age-related nuclear cataract, bilateral: Secondary | ICD-10-CM | POA: Diagnosis not present

## 2015-01-20 ENCOUNTER — Encounter: Payer: Self-pay | Admitting: Podiatrist

## 2015-01-20 ENCOUNTER — Ambulatory Visit (INDEPENDENT_AMBULATORY_CARE_PROVIDER_SITE_OTHER): Payer: Commercial Managed Care - HMO | Admitting: Podiatrist

## 2015-01-20 VITALS — BP 168/93 | HR 104 | Temp 99.4°F | Resp 14

## 2015-01-20 DIAGNOSIS — S93401S Sprain of unspecified ligament of right ankle, sequela: Secondary | ICD-10-CM

## 2015-01-20 DIAGNOSIS — L600 Ingrowing nail: Secondary | ICD-10-CM

## 2015-01-20 MED ORDER — HYDROCODONE-ACETAMINOPHEN 5-325 MG PO TABS: 1.0000 | ORAL_TABLET | ORAL | Status: DC | PRN

## 2015-01-20 NOTE — Progress Notes (Signed)
Chief Complaint  Patient presents with  . Toe Pain    "Dr. Valentina Lucks said if it is swelling or pain to come by." Pt states he has had pain and swelling, changed the dressings this morning to the left 3,4th toenails areas     HPI: Patient is 74 y.o. male who presents today for follow up status post permenent phenol matrixectomy of the lateral sides of left 3,4 toes. He relates pain and swelling in and around the toes.  He has been covering with bandages and using the neiosporin as instructed. He has been keeping them clean with dial soap.     Allergies  Allergen Reactions  . Clindamycin/Lincomycin Anaphylaxis  . Ace Inhibitors     REACTION: angioedema  . Ciprofloxacin     REACTION: swellomg    Physical Exam Neurovascular status intact. Excellent post op appearance of the toenails is noted. No redness, minimal swelling is present.  No drainage expressed. No sign of infection seen.  Discomfort with pressure is noted.  The right ankle feels improved since using the splint, he is having pain on his left foot in the arch as well and wonders if the ankle brace would be helpful for the left foot as well.  Pain at the insertion of the posterior tibial tendon insertion is noted left foot with pes planus noted.    Assessment: status post permanent ingrown toenail procedure toes 3,4 left foot, pttd left foot  Plan: recommended leaving toes open to air more to help dry out the skin and to prevent maceration.  He may soak in epsom salts if he would like and instructions are given.  Dispensed another ankle brace for the left ankle pain he is experiencing as well as to wear the current brace on the right.  This should help a lot with the arch discomfort.  He will be seen back as needed or if the pain does not resolve. rx for hydrocodone written for the pain.

## 2015-01-20 NOTE — Patient Instructions (Signed)
Leave your toes on your left foot open to air as long as you are able-- you don't have to soak, but if you would like to do soaks, do the epsom salt soaks once daily-- otherwise just keep the toes clean with dial soap and water.    Soak Instructions    THE DAY AFTER THE PROCEDURE  Place 1/4 cup of epsom salts in a quart of warm tap water.  Submerge your foot or feet with outer bandage intact for the initial soak; this will allow the bandage to become moist and wet for easy lift off.  Once you remove your bandage, continue to soak in the solution for 20 minutes.  This soak should be done twice a day.  Next, remove your foot or feet from solution, blot dry the affected area and cover.  You may use a band aid large enough to cover the area or use gauze and tape.  Apply other medications to the area as directed by the doctor such as polysporin neosporin.  IF YOUR SKIN BECOMES IRRITATED WHILE USING THESE INSTRUCTIONS, IT IS OKAY TO SWITCH TO  WHITE VINEGAR AND WATER. Or you may use antibacterial soap and water to keep the toe clean

## 2015-02-21 DIAGNOSIS — H4011X3 Primary open-angle glaucoma, severe stage: Secondary | ICD-10-CM | POA: Diagnosis not present

## 2015-02-21 DIAGNOSIS — H2513 Age-related nuclear cataract, bilateral: Secondary | ICD-10-CM | POA: Diagnosis not present

## 2015-06-24 DIAGNOSIS — N138 Other obstructive and reflux uropathy: Secondary | ICD-10-CM | POA: Diagnosis not present

## 2015-06-24 DIAGNOSIS — C61 Malignant neoplasm of prostate: Secondary | ICD-10-CM | POA: Diagnosis not present

## 2015-06-24 DIAGNOSIS — N401 Enlarged prostate with lower urinary tract symptoms: Secondary | ICD-10-CM | POA: Diagnosis not present

## 2015-07-04 DIAGNOSIS — E785 Hyperlipidemia, unspecified: Secondary | ICD-10-CM | POA: Diagnosis not present

## 2015-07-04 DIAGNOSIS — I10 Essential (primary) hypertension: Secondary | ICD-10-CM | POA: Diagnosis not present

## 2015-07-04 DIAGNOSIS — Z8669 Personal history of other diseases of the nervous system and sense organs: Secondary | ICD-10-CM | POA: Diagnosis not present

## 2015-07-04 DIAGNOSIS — E1129 Type 2 diabetes mellitus with other diabetic kidney complication: Secondary | ICD-10-CM | POA: Diagnosis not present

## 2015-07-06 DIAGNOSIS — Z125 Encounter for screening for malignant neoplasm of prostate: Secondary | ICD-10-CM | POA: Diagnosis not present

## 2015-07-11 DIAGNOSIS — Z0000 Encounter for general adult medical examination without abnormal findings: Secondary | ICD-10-CM | POA: Diagnosis not present

## 2015-07-11 DIAGNOSIS — Z23 Encounter for immunization: Secondary | ICD-10-CM | POA: Diagnosis not present

## 2015-07-11 DIAGNOSIS — E785 Hyperlipidemia, unspecified: Secondary | ICD-10-CM | POA: Diagnosis not present

## 2015-07-11 DIAGNOSIS — R748 Abnormal levels of other serum enzymes: Secondary | ICD-10-CM | POA: Diagnosis not present

## 2015-07-11 DIAGNOSIS — Z8669 Personal history of other diseases of the nervous system and sense organs: Secondary | ICD-10-CM | POA: Diagnosis not present

## 2015-07-11 DIAGNOSIS — I10 Essential (primary) hypertension: Secondary | ICD-10-CM | POA: Diagnosis not present

## 2015-07-11 DIAGNOSIS — R809 Proteinuria, unspecified: Secondary | ICD-10-CM | POA: Diagnosis not present

## 2015-07-11 DIAGNOSIS — R7301 Impaired fasting glucose: Secondary | ICD-10-CM | POA: Diagnosis not present

## 2015-07-11 DIAGNOSIS — C61 Malignant neoplasm of prostate: Secondary | ICD-10-CM | POA: Diagnosis not present

## 2015-07-12 DIAGNOSIS — Z1212 Encounter for screening for malignant neoplasm of rectum: Secondary | ICD-10-CM | POA: Diagnosis not present

## 2015-07-21 DIAGNOSIS — H4011X3 Primary open-angle glaucoma, severe stage: Secondary | ICD-10-CM | POA: Diagnosis not present

## 2015-07-21 DIAGNOSIS — H2513 Age-related nuclear cataract, bilateral: Secondary | ICD-10-CM | POA: Diagnosis not present

## 2015-08-11 ENCOUNTER — Encounter: Payer: Self-pay | Admitting: Gastroenterology

## 2015-10-10 ENCOUNTER — Encounter: Payer: Self-pay | Admitting: Gastroenterology

## 2015-10-10 ENCOUNTER — Ambulatory Visit (INDEPENDENT_AMBULATORY_CARE_PROVIDER_SITE_OTHER): Payer: Commercial Managed Care - HMO | Admitting: Gastroenterology

## 2015-10-10 VITALS — BP 164/108 | HR 112 | Ht 71.0 in | Wt 200.5 lb

## 2015-10-10 DIAGNOSIS — Z86010 Personal history of colonic polyps: Secondary | ICD-10-CM | POA: Diagnosis not present

## 2015-10-10 DIAGNOSIS — R195 Other fecal abnormalities: Secondary | ICD-10-CM | POA: Diagnosis not present

## 2015-10-10 NOTE — Patient Instructions (Addendum)
It has been recommended to you by your physician that you have a(n) Colonoscopy completed. Per your request, we did not schedule the procedure(s) today. Please contact our office at [phone] and ask for Estill Bamberg should you decide to have the procedure completed.  Thank you for choosing me and [facility].  Pricilla Riffle. Dagoberto Ligas., MD., Marval Regal  cc: [name], MD

## 2015-10-10 NOTE — Progress Notes (Signed)
    History of Present Illness: This is a 74 year old male referred by [name], MD for the evaluation of occult blood in stools. Found on recent physical exam to have Hempsure positive stool. He has a history of small adenomatous colon polyps on colonoscopy from November 2011 and is due for a five-year surveillance. He has no other gastrointestinal complaints. Denies weight loss, abdominal pain, constipation, diarrhea, change in stool caliber, melena, hematochezia, nausea, vomiting, dysphagia, reflux symptoms, chest pain.  Review of Systems: Pertinent positive and negative review of systems were noted in the above HPI section. All other review of systems were otherwise negative.  Current Medications, Allergies, Past Medical History, Past Surgical History, Family History and Social History were reviewed in [employer] record.  Physical Exam: General: Well developed, well nourished, no acute distress Head: Normocephalic and atraumatic Eyes:  sclerae anicteric, EOMI Ears: Normal auditory acuity Mouth: No deformity or lesions Neck: Supple, no masses or thyromegaly Lungs: Clear throughout to auscultation Heart: Regular rate and rhythm; no murmurs, rubs or bruits Abdomen: Soft, non tender and non distended. No masses, hepatosplenomegaly or hernias noted. Normal Bowel sounds Rectal: deferred to colonoscopy Musculoskeletal: Symmetrical with no gross deformities  Skin: No lesions on visible extremities Pulses:  Normal pulses noted Extremities: No clubbing, cyanosis, edema or deformities noted Neurological: Alert oriented x 4, grossly nonfocal Cervical Nodes:  No significant cervical adenopathy Inguinal Nodes: No significant inguinal adenopathy Psychological:  Alert and cooperative. Normal mood and affect  Assessment and Recommendations:  1. Hemosure positive stool. Personal history of adenomatous colon polyps in 10/2010. Schedule colonoscopy. The risks (including  bleeding, perforation, infection, missed lesions, medication reactions and possible hospitalization or surgery if complications occur), benefits, and alternatives to colonoscopy with possible biopsy and possible polypectomy were discussed with the patient and they consent to proceed. He wants to coordinate a care partner and ride prior to scheduling and states he will call back to schedule.   cc: [name], MD [address] [place], [place] [address]

## 2015-10-11 ENCOUNTER — Telehealth: Payer: Self-pay | Admitting: Gastroenterology

## 2015-10-11 NOTE — Telephone Encounter (Signed)
Scheduled patient for Colonoscopy on 11/30/15 at 2:00pm and pre visit on 11/18/15 at 10:00am. Pt verbalized understanding.

## 2015-10-27 DIAGNOSIS — I10 Essential (primary) hypertension: Secondary | ICD-10-CM | POA: Diagnosis not present

## 2015-10-27 DIAGNOSIS — E1129 Type 2 diabetes mellitus with other diabetic kidney complication: Secondary | ICD-10-CM | POA: Diagnosis not present

## 2015-10-27 DIAGNOSIS — Z6827 Body mass index (BMI) 27.0-27.9, adult: Secondary | ICD-10-CM | POA: Diagnosis not present

## 2015-10-27 DIAGNOSIS — C61 Malignant neoplasm of prostate: Secondary | ICD-10-CM | POA: Diagnosis not present

## 2015-10-27 DIAGNOSIS — E1121 Type 2 diabetes mellitus with diabetic nephropathy: Secondary | ICD-10-CM | POA: Diagnosis not present

## 2015-11-16 DIAGNOSIS — Z23 Encounter for immunization: Secondary | ICD-10-CM | POA: Diagnosis not present

## 2015-11-18 ENCOUNTER — Ambulatory Visit (AMBULATORY_SURGERY_CENTER): Payer: Self-pay

## 2015-11-18 ENCOUNTER — Encounter: Payer: Self-pay | Admitting: Gastroenterology

## 2015-11-18 VITALS — Ht 72.0 in | Wt 197.0 lb

## 2015-11-18 DIAGNOSIS — R195 Other fecal abnormalities: Secondary | ICD-10-CM

## 2015-11-18 MED ORDER — SUPREP BOWEL PREP KIT 17.5-3.13-1.6 GM/177ML PO SOLN: 1.0000 | Freq: Once | ORAL | Status: DC

## 2015-11-18 NOTE — Progress Notes (Signed)
No allergies to eggs or soy or flu shot No diet/weight loss meds No home oxygen No past problems with anesthesia  No internet

## 2015-11-24 DIAGNOSIS — H401133 Primary open-angle glaucoma, bilateral, severe stage: Secondary | ICD-10-CM | POA: Diagnosis not present

## 2015-11-24 DIAGNOSIS — H532 Diplopia: Secondary | ICD-10-CM | POA: Diagnosis not present

## 2015-11-24 DIAGNOSIS — H2513 Age-related nuclear cataract, bilateral: Secondary | ICD-10-CM | POA: Diagnosis not present

## 2015-11-30 ENCOUNTER — Encounter: Payer: Self-pay | Admitting: Gastroenterology

## 2015-11-30 ENCOUNTER — Encounter: Payer: Commercial Managed Care - HMO | Admitting: Gastroenterology

## 2015-11-30 ENCOUNTER — Ambulatory Visit (AMBULATORY_SURGERY_CENTER): Payer: Commercial Managed Care - HMO | Admitting: Gastroenterology

## 2015-11-30 VITALS — BP 130/87 | HR 84 | Temp 99.1°F | Resp 17 | Ht 72.0 in | Wt 197.0 lb

## 2015-11-30 DIAGNOSIS — I10 Essential (primary) hypertension: Secondary | ICD-10-CM | POA: Diagnosis not present

## 2015-11-30 DIAGNOSIS — R195 Other fecal abnormalities: Secondary | ICD-10-CM

## 2015-11-30 DIAGNOSIS — Z86010 Personal history of colonic polyps: Secondary | ICD-10-CM | POA: Diagnosis not present

## 2015-11-30 DIAGNOSIS — R569 Unspecified convulsions: Secondary | ICD-10-CM | POA: Diagnosis not present

## 2015-11-30 DIAGNOSIS — E119 Type 2 diabetes mellitus without complications: Secondary | ICD-10-CM | POA: Diagnosis not present

## 2015-11-30 DIAGNOSIS — Z8673 Personal history of transient ischemic attack (TIA), and cerebral infarction without residual deficits: Secondary | ICD-10-CM | POA: Diagnosis not present

## 2015-11-30 DIAGNOSIS — E669 Obesity, unspecified: Secondary | ICD-10-CM | POA: Diagnosis not present

## 2015-11-30 LAB — GLUCOSE, CAPILLARY: GLUCOSE-CAPILLARY: 163 mg/dL — AB (ref 65–99)

## 2015-11-30 MED ORDER — DEXTROSE 5 % IV SOLN: INTRAVENOUS | Status: DC

## 2015-11-30 MED ORDER — SODIUM CHLORIDE 0.9 % IV SOLN: 500.0000 mL | INTRAVENOUS | Status: DC

## 2015-11-30 NOTE — Progress Notes (Signed)
Pt. Stated that he had one seizure about 15 years ago after his mother died and has not had ant since.

## 2015-11-30 NOTE — Patient Instructions (Signed)
YOU HAD AN ENDOSCOPIC PROCEDURE TODAY AT [facility]:   Refer to the procedure report that was given to you for any specific questions about what was found during the examination.  If the procedure report does not answer your questions, please call your gastroenterologist to clarify.  If you requested that your care partner not be given the details of your procedure findings, then the procedure report has been included in a sealed envelope for you to review at your convenience later.  YOU SHOULD EXPECT: Some feelings of bloating in the abdomen. Passage of more gas than usual.  Walking can help get rid of the air that was put into your GI tract during the procedure and reduce the bloating. If you had a lower endoscopy (such as a colonoscopy or flexible sigmoidoscopy) you may notice spotting of blood in your stool or on the toilet paper. If you underwent a bowel prep for your procedure, you may not have a normal bowel movement for a few days.  Please Note:  You might notice some irritation and congestion in your nose or some drainage.  This is from the oxygen used during your procedure.  There is no need for concern and it should clear up in a day or so.  SYMPTOMS TO REPORT IMMEDIATELY:   Following lower endoscopy (colonoscopy or flexible sigmoidoscopy):  Excessive amounts of blood in the stool  Significant tenderness or worsening of abdominal pains  Swelling of the abdomen that is new, acute  Fever of 100F or higher   For urgent or emergent issues, a gastroenterologist can be reached at any hour by calling [phone].   DIET: Your first meal following the procedure should be a small meal and then it is ok to progress to your normal diet. Heavy or fried foods are harder to digest and may make you feel nauseous or bloated.  Likewise, meals heavy in dairy and vegetables can increase bloating.  Drink plenty of fluids but you should avoid alcoholic beverages for 24  hours.  ACTIVITY:  You should plan to take it easy for the rest of today and you should NOT DRIVE or use heavy machinery until tomorrow (because of the sedation medicines used during the test).    FOLLOW UP: Our staff will call the number listed on your records the next business day following your procedure to check on you and address any questions or concerns that you may have regarding the information given to you following your procedure. If we do not reach you, we will leave a message.  However, if you are feeling well and you are not experiencing any problems, there is no need to return our call.  We will assume that you have returned to your regular daily activities without incident.  If any biopsies were taken you will be contacted by phone or by letter within the next 1-3 weeks.  Please call us at [phone] if you have not heard about the biopsies in 3 weeks.    SIGNATURES/CONFIDENTIALITY: You and/or your care partner have signed paperwork which will be entered into your electronic medical record.  These signatures attest to the fact that that the information above on your After Visit Summary has been reviewed and is understood.  Full responsibility of the confidentiality of this discharge information lies with you and/or your care-partner.  Hemorrhoid information given.   You will not need another colonoscopy, as these proceedures usually stop at 72 years of age.

## 2015-11-30 NOTE — Progress Notes (Signed)
Report to PACU, RN, vss, BBS= Clear.  

## 2015-11-30 NOTE — Op Note (Signed)
[place]  [address]. [place], [address]   COLONOSCOPY PROCEDURE REPORT  PATIENT: Anthony Walters, Anthony Walters  MR#: [id number] BIRTHDATE: 03-Mar-1941 , 74  yrs. old GENDER: male ENDOSCOPIST: Ladene Artist, MD, [facility] REFERRED BY:  [name], M.D. PROCEDURE DATE:  11/30/2015 PROCEDURE:   Colonoscopy, diagnostic First Screening Colonoscopy - Avg.  risk and is 50 yrs.  old or older - No.  Prior Negative Screening - Now for repeat screening. N/A  History of Adenoma - Now for follow-up colonoscopy & has been > or = to 3 yrs.  Yes hx of adenoma.  Has been 3 or more years since last colonoscopy.  Polyps removed today? No Recommend repeat exam, <10 yrs? No ASA CLASS:   Class III INDICATIONS:Evaluation of unexplained GI bleeding, Surveillance due to prior colonic neoplasia, PH Colon Adenoma, and occult blood in stool. MEDICATIONS: Monitored anesthesia care and Propofol 200 mg IV DESCRIPTION OF PROCEDURE:   After the risks benefits and alternatives of the procedure were thoroughly explained, informed consent was obtained.  The digital rectal exam revealed no abnormalities of the rectum.   The LB [id number]  endoscope was introduced through the anus and advanced to the cecum, which was identified by both the appendix and ileocecal valve. No adverse events experienced with a tortuous colon.   The quality of the prep was adequate (Suprep was used)  The instrument was then slowly withdrawn as the colon was fully examined. Estimated blood loss is zero unless otherwise noted in this procedure report.    COLON FINDINGS: A normal appearing cecum, ileocecal valve, and appendiceal orifice were identified.  The ascending, transverse, descending, sigmoid colon, and rectum appeared unremarkable. Retroflexed views revealed internal Grade I hemorrhoids. The time to cecum = 3.9 Withdrawal time = 15.1   The scope was withdrawn and the procedure completed. COMPLICATIONS: There  were no immediate complications.  ENDOSCOPIC IMPRESSION: 1.  Normal colonoscopy 2.  Grade I internal hemorrhoids  RECOMMENDATIONS: 1.  Hemorrhoids are likely source for occult blood in stool 2.  Given your age, you will not need another colonoscopy for colon cancer screening or polyp surveillance.  These types of tests usually stop around the age 27.  eSigned:  Ladene Artist, MD, [facility] 11/30/2015 2:27 PM   [C

## 2015-12-01 ENCOUNTER — Telehealth: Payer: Self-pay | Admitting: *Deleted

## 2015-12-01 LAB — GLUCOSE, CAPILLARY: GLUCOSE-CAPILLARY: 81 mg/dL (ref 65–99)

## 2015-12-01 NOTE — Telephone Encounter (Signed)
Message left

## 2015-12-07 DIAGNOSIS — H04129 Dry eye syndrome of unspecified lacrimal gland: Secondary | ICD-10-CM | POA: Diagnosis not present

## 2015-12-07 DIAGNOSIS — H538 Other visual disturbances: Secondary | ICD-10-CM | POA: Diagnosis not present

## 2015-12-07 DIAGNOSIS — Z8669 Personal history of other diseases of the nervous system and sense organs: Secondary | ICD-10-CM | POA: Diagnosis not present

## 2015-12-07 DIAGNOSIS — E119 Type 2 diabetes mellitus without complications: Secondary | ICD-10-CM | POA: Diagnosis not present

## 2016-01-10 ENCOUNTER — Emergency Department (HOSPITAL_COMMUNITY): Payer: Commercial Managed Care - HMO

## 2016-01-10 ENCOUNTER — Emergency Department (HOSPITAL_COMMUNITY)
Admission: EM | Admit: 2016-01-10 | Discharge: 2016-01-10 | Disposition: A | Discharge status: Home / Self Care | Payer: Commercial Managed Care - HMO | Attending: Emergency Medicine | Admitting: Emergency Medicine

## 2016-01-10 ENCOUNTER — Encounter (HOSPITAL_COMMUNITY): Payer: Self-pay

## 2016-01-10 DIAGNOSIS — R112 Nausea with vomiting, unspecified: Secondary | ICD-10-CM | POA: Insufficient documentation

## 2016-01-10 DIAGNOSIS — H409 Unspecified glaucoma: Secondary | ICD-10-CM | POA: Diagnosis not present

## 2016-01-10 DIAGNOSIS — Z7984 Long term (current) use of oral hypoglycemic drugs: Secondary | ICD-10-CM | POA: Diagnosis not present

## 2016-01-10 DIAGNOSIS — M1990 Unspecified osteoarthritis, unspecified site: Secondary | ICD-10-CM | POA: Insufficient documentation

## 2016-01-10 DIAGNOSIS — I10 Essential (primary) hypertension: Secondary | ICD-10-CM | POA: Diagnosis not present

## 2016-01-10 DIAGNOSIS — Z7982 Long term (current) use of aspirin: Secondary | ICD-10-CM | POA: Diagnosis not present

## 2016-01-10 DIAGNOSIS — Z8673 Personal history of transient ischemic attack (TIA), and cerebral infarction without residual deficits: Secondary | ICD-10-CM | POA: Insufficient documentation

## 2016-01-10 DIAGNOSIS — Z8546 Personal history of malignant neoplasm of prostate: Secondary | ICD-10-CM | POA: Diagnosis not present

## 2016-01-10 DIAGNOSIS — R1013 Epigastric pain: Secondary | ICD-10-CM | POA: Diagnosis not present

## 2016-01-10 DIAGNOSIS — Z87891 Personal history of nicotine dependence: Secondary | ICD-10-CM | POA: Insufficient documentation

## 2016-01-10 DIAGNOSIS — Z79899 Other long term (current) drug therapy: Secondary | ICD-10-CM | POA: Diagnosis not present

## 2016-01-10 DIAGNOSIS — R531 Weakness: Secondary | ICD-10-CM | POA: Insufficient documentation

## 2016-01-10 DIAGNOSIS — Z86018 Personal history of other benign neoplasm: Secondary | ICD-10-CM | POA: Diagnosis not present

## 2016-01-10 DIAGNOSIS — R42 Dizziness and giddiness: Secondary | ICD-10-CM

## 2016-01-10 DIAGNOSIS — E119 Type 2 diabetes mellitus without complications: Secondary | ICD-10-CM | POA: Insufficient documentation

## 2016-01-10 DIAGNOSIS — R404 Transient alteration of awareness: Secondary | ICD-10-CM | POA: Diagnosis not present

## 2016-01-10 LAB — CBC WITH DIFFERENTIAL/PLATELET
Basophils Absolute: 0 10*3/uL (ref 0.0–0.1)
Basophils Relative: 1 %
Eosinophils Absolute: 0.1 10*3/uL (ref 0.0–0.7)
Eosinophils Relative: 1 %
HCT: 42.5 % (ref 39.0–52.0)
Hemoglobin: 14 g/dL (ref 13.0–17.0)
Lymphocytes Relative: 34 %
Lymphs Abs: 2.3 10*3/uL (ref 0.7–4.0)
MCH: 29.5 pg (ref 26.0–34.0)
MCHC: 32.9 g/dL (ref 30.0–36.0)
MCV: 89.5 fL (ref 78.0–100.0)
Monocytes Absolute: 0.6 10*3/uL (ref 0.1–1.0)
Monocytes Relative: 8 %
Neutro Abs: 3.7 10*3/uL (ref 1.7–7.7)
Neutrophils Relative %: 56 %
Platelets: 272 10*3/uL (ref 150–400)
RBC: 4.75 MIL/uL (ref 4.22–5.81)
RDW: 13.3 % (ref 11.5–15.5)
WBC: 6.6 10*3/uL (ref 4.0–10.5)

## 2016-01-10 LAB — URINALYSIS, ROUTINE W REFLEX MICROSCOPIC
Bilirubin Urine: NEGATIVE
Glucose, UA: NEGATIVE mg/dL
Hgb urine dipstick: NEGATIVE
Ketones, ur: NEGATIVE mg/dL
Leukocytes, UA: NEGATIVE
Nitrite: NEGATIVE
Protein, ur: NEGATIVE mg/dL
Specific Gravity, Urine: 1.006 (ref 1.005–1.030)
pH: 7.5 (ref 5.0–8.0)

## 2016-01-10 LAB — COMPREHENSIVE METABOLIC PANEL
ALT: 11 U/L — ABNORMAL LOW (ref 17–63)
AST: 20 U/L (ref 15–41)
Albumin: 3.9 g/dL (ref 3.5–5.0)
Alkaline Phosphatase: 125 U/L (ref 38–126)
Anion gap: 10 (ref 5–15)
BUN: 11 mg/dL (ref 6–20)
CO2: 27 mmol/L (ref 22–32)
Calcium: 9.8 mg/dL (ref 8.9–10.3)
Chloride: 102 mmol/L (ref 101–111)
Creatinine, Ser: 0.95 mg/dL (ref 0.61–1.24)
GFR calc Af Amer: >60 mL/min (ref >60)
GFR calc non Af Amer: >60 mL/min (ref >60)
Glucose, Bld: 92 mg/dL (ref 65–99)
Potassium: 4.1 mmol/L (ref 3.5–5.1)
Sodium: 139 mmol/L (ref 135–145)
Total Bilirubin: 0.4 mg/dL (ref 0.3–1.2)
Total Protein: 7.4 g/dL (ref 6.5–8.1)

## 2016-01-10 LAB — I-STAT TROPONIN, ED: Troponin i, poc: 0.01 ng/mL (ref 0.00–0.08)

## 2016-01-10 LAB — TROPONIN I: Troponin I: <0.03 ng/mL (ref <0.031)

## 2016-01-10 LAB — LIPASE, BLOOD: Lipase: 33 U/L (ref 11–51)

## 2016-01-10 MED ORDER — MECLIZINE HCL 25 MG PO TABS
25.0000 mg | ORAL_TABLET | Freq: Once | ORAL | Status: AC
  Administered 2016-01-10: 25 mg via ORAL
  Filled 2016-01-10: qty 1

## 2016-01-10 MED ORDER — FAMOTIDINE 20 MG PO TABS: 20.0000 mg | ORAL_TABLET | Freq: Two times a day (BID) | ORAL | Status: DC

## 2016-01-10 MED ORDER — GI COCKTAIL ~~LOC~~
30.0000 mL | Freq: Once | ORAL | Status: AC
  Administered 2016-01-10: 30 mL via ORAL
  Filled 2016-01-10: qty 30

## 2016-01-10 MED ORDER — MECLIZINE HCL 12.5 MG PO TABS: 12.5000 mg | ORAL_TABLET | Freq: Three times a day (TID) | ORAL | Status: AC

## 2016-01-10 MED ORDER — SODIUM CHLORIDE 0.9 % IV BOLUS (SEPSIS)
1000.0000 mL | Freq: Once | INTRAVENOUS | Status: AC
  Administered 2016-01-10: 1000 mL via INTRAVENOUS

## 2016-01-10 NOTE — ED Notes (Signed)
Per EMS - pt woke up "not feeling right" this morning - dry heaves, dizziness, acute onset witness. Pt normally active. Rising elevation in leads 1,2,3. Pt c/o epigastric pain - took antacids w/ no relief. Hypertensive -SBP 171/114. Temp 99.4. CBG 122. No stroke-like symptoms.

## 2016-01-10 NOTE — ED Provider Notes (Signed)
CSN: [id number]     Arrival date & time 01/10/16  1910 History   First MD Initiated Contact with Patient 01/10/16 1911     Chief Complaint  Patient presents with  . Abdominal Pain  . Weakness  . Hypertension   HPI  Patient presents with concern of epigastric discomfort, dizziness, nausea. Symptoms began about 12 hours ago, after the patient awoke. Pain is that the discomfort is worse with ambulation, specifically dizziness. No syncope, confusion, disorders, visual changes, weakness in any extremity. No fever, chills. Patient did have heaves, but has not had sustained vomiting. No diarrhea. He acknowledges a Hx of TIA, states that he takes aspirin daily. Since onset, no other new concerning changes in his condition.    Past Medical History  Diagnosis Date  . Diabetes mellitus without complication ([place])   . Stroke ([place])   . TIA (transient ischemic attack)   . Hypertension   . Seizures ([place])   . 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 Hyperlipidemia   . 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 Arthritis   . Glaucoma    Past Surgical History  Procedure Laterality Date  . Meniscus repair Right   . Tonsillectomy    . Vasectomy    . Prostate biopsy    . Dental surgery      implants  . Colonoscopy    . Polypectomy    . Left eye surgery     Family History  Problem Relation Age of Onset  . Heart failure Mother   . Diabetes Mother   . Heart attack Father   . Colon cancer Neg Hx    Social History  Substance Use Topics  . Smoking status: Former Smoker    Types: Cigars  . Smokeless tobacco: Never Used  . Alcohol Use: No    Review of Systems  Constitutional:       Per HPI, otherwise negative  HENT:       Per HPI, otherwise negative  Respiratory:       Per HPI, otherwise negative  Cardiovascular:       Per HPI, otherwise negative  Gastrointestinal: Positive for nausea and vomiting.  Endocrine:       Negative aside from HPI  Genitourinary:       Neg aside from  HPI   Musculoskeletal:       Per HPI, otherwise negative  Skin: Negative.   Neurological: Positive for dizziness. Negative for syncope.      Allergies  Clindamycin/lincomycin; Grapefruit concentrate; Ace inhibitors; and Ciprofloxacin  Home Medications   Prior to Admission medications   Medication Sig Start Date End Date Taking? Authorizing Provider  aspirin EC 325 MG tablet Take 325 mg by mouth daily.   Yes Historical Provider, MD  bimatoprost (LUMIGAN) 0.01 % SOLN Place 1 drop into both eyes at bedtime.    Yes Historical Provider, MD  Brinzolamide-Brimonidine 1-0.2 % SUSP Apply 1 drop to eye 2 (two) times daily.   Yes Historical Provider, MD  cloNIDine (CATAPRES) 0.2 MG tablet Take 0.2 mg by mouth 3 (three) times daily.   Yes Historical Provider, MD  gemfibrozil (LOPID) 600 MG tablet Take 600 mg by mouth 2 (two) times daily.   Yes Historical Provider, MD  hydrochlorothiazide (HYDRODIURIL) 25 MG tablet Take 25 mg by mouth daily.   Yes Historical Provider, MD  ipratropium (ATROVENT) 0.06 % nasal spray Place 2 sprays into both nostrils every 4 (four) hours as needed for rhinitis. 11/22/14  Yes Alroy Dust  H Baker, PA-C  meloxicam (MOBIC) 15 MG tablet Take 15 mg by mouth daily as needed for pain.   Yes Historical Provider, MD  metFORMIN (GLUMETZA) 500 MG (MOD) 24 hr tablet Take 500 mg by mouth daily with supper.   Yes Historical Provider, MD  phenytoin (DILANTIN) 100 MG ER capsule Take 200 mg by mouth 2 (two) times daily.   Yes Historical Provider, MD  verapamil (VERELAN PM) 360 MG 24 hr capsule Take 360 mg by mouth daily.   Yes Historical Provider, MD  famotidine (PEPCID) 20 MG tablet Take 1 tablet (20 mg total) by mouth 2 (two) times daily. 01/10/16   Carmin Muskrat, MD  meclizine (ANTIVERT) 12.5 MG tablet Take 1 tablet (12.5 mg total) by mouth 3 (three) times daily. 01/10/16 01/16/16  Carmin Muskrat, MD   BP 173/98 mmHg  Pulse 86  Temp(Src) 99.2 F (37.3 C) (Oral)  Resp 12  Ht 6' (1.829  m)  Wt 204 lb (92.534 kg)  BMI 27.66 kg/m2  SpO2 100% Physical Exam  Constitutional: He is oriented to person, place, and time. He appears well-developed. No distress.  HENT:  Head: Normocephalic and atraumatic.  Eyes: Conjunctivae and EOM are normal.  Cardiovascular: Normal rate and regular rhythm.   Pulmonary/Chest: Effort normal. No stridor. No respiratory distress.  Abdominal: He exhibits no distension.  Minimal discomfort - patient indicates that the greatest discomfort was in the epigastrum.   Musculoskeletal: He exhibits no edema.  Neurological: He is alert and oriented to person, place, and time.  Skin: Skin is warm and dry.  Psychiatric: He has a normal mood and affect.  Nursing note and vitals reviewed.   ED Course  Procedures (including critical care time) Labs Review Labs Reviewed  COMPREHENSIVE METABOLIC PANEL - Abnormal; Notable for the following:    ALT 11 (*)    All other components within normal limits  LIPASE, BLOOD  TROPONIN I  CBC WITH DIFFERENTIAL/PLATELET  URINALYSIS, ROUTINE W REFLEX MICROSCOPIC (NOT AT [facility])  Randolm Idol, ED    Imaging Review Dg Chest 2 View  01/10/2016  CLINICAL DATA:  Dizziness. EXAM: CHEST  2 VIEW COMPARISON:  08/06/2014 FINDINGS: The heart size and mediastinal contours are within normal limits. Both lungs are clear. The visualized skeletal structures are unremarkable. IMPRESSION: No active cardiopulmonary disease. Electronically Signed   By: Kerby Moors M.D.   On: 01/10/2016 20:21   I have personally reviewed and evaluated these images and lab results as part of my medical decision-making.   EKG Interpretation   Date/Time:  Tuesday January 10 2016 19:20:00 EST Ventricular Rate:  93 PR Interval:  188 QRS Duration: 76 QT Interval:  336 QTC Calculation: 418 R Axis:   53 Text Interpretation:  Sinus rhythm Abnormal R-wave progression, early  transition Nonspecific T abnormalities, lateral leads Sinus rhythm ST-t   wave abnormality No significant change since last tracing Abnormal ekg  Confirmed by Carmin Muskrat  MD [phone]) on 01/10/2016 7:24:38 PM     On repeat exam the patient is in no distress, sitting upright, speaking clearly, no ongoing symptoms. He has been ambulatory, with no distress. We discussed all findings again, and with multiple family members. We discussed the largely reassuring findings, but the need for ongoing evaluation as an outpatient.  MDM   Final diagnoses:  Dizziness  Epigastric pain   Fish presents with multiple complaints, generally not feeling well for the past day. Hent: Patient is awake, alert, moderately hypertensive, but with no evidence for  new end organ effects.  Patient improved here with fluids, GI cocktail, meclizine. Patient description of epigastric discomfort, with no chest pain, and reassuring troponin, EKG are reassuring for the low suspicion of ongoing, atypical cardiac etiology. No evidence for stroke given the patient's reassuring physical exam, denial of this, confusion, speech deficits. Patient may have vertigo, versus gastric etiology. Patient discharged in stable condition after resolution of symptoms.    Carmin Muskrat, MD 01/11/16 [phone]

## 2016-01-10 NOTE — ED Notes (Signed)
Pt ambulatory in hallway w/ 1 staff assist.

## 2016-01-10 NOTE — Discharge Instructions (Signed)
As discussed, your evaluation today has been largely reassuring.  But, it is important that you monitor your condition carefully, and do not hesitate to return to the ED if you develop new, or concerning changes in your condition.  Please follow-up with your physician for appropriate ongoing care.  

## 2016-01-10 NOTE — ED Notes (Signed)
Pt granddaughter removed pt jewelery. Necklace and bracelet.

## 2016-01-16 ENCOUNTER — Other Ambulatory Visit: Payer: Self-pay | Admitting: Internal Medicine

## 2016-01-16 DIAGNOSIS — H6123 Impacted cerumen, bilateral: Secondary | ICD-10-CM | POA: Diagnosis not present

## 2016-01-16 DIAGNOSIS — R000 Tachycardia, unspecified: Secondary | ICD-10-CM

## 2016-01-16 DIAGNOSIS — R2689 Other abnormalities of gait and mobility: Secondary | ICD-10-CM

## 2016-01-16 DIAGNOSIS — R262 Difficulty in walking, not elsewhere classified: Secondary | ICD-10-CM

## 2016-01-16 DIAGNOSIS — R42 Dizziness and giddiness: Secondary | ICD-10-CM | POA: Diagnosis not present

## 2016-01-16 DIAGNOSIS — Z79899 Other long term (current) drug therapy: Secondary | ICD-10-CM | POA: Diagnosis not present

## 2016-01-16 DIAGNOSIS — Z6827 Body mass index (BMI) 27.0-27.9, adult: Secondary | ICD-10-CM | POA: Diagnosis not present

## 2016-01-16 DIAGNOSIS — R4182 Altered mental status, unspecified: Secondary | ICD-10-CM | POA: Diagnosis not present

## 2016-01-16 DIAGNOSIS — R413 Other amnesia: Secondary | ICD-10-CM

## 2016-01-16 DIAGNOSIS — I10 Essential (primary) hypertension: Secondary | ICD-10-CM | POA: Diagnosis not present

## 2016-01-16 DIAGNOSIS — Z8669 Personal history of other diseases of the nervous system and sense organs: Secondary | ICD-10-CM | POA: Diagnosis not present

## 2016-01-16 DIAGNOSIS — H538 Other visual disturbances: Secondary | ICD-10-CM | POA: Diagnosis not present

## 2016-01-17 ENCOUNTER — Ambulatory Visit
Admission: RE | Admit: 2016-01-17 | Discharge: 2016-01-17 | Disposition: A | Discharge status: Home / Self Care | Payer: Commercial Managed Care - HMO | Source: Ambulatory Visit | Attending: Internal Medicine | Admitting: Internal Medicine

## 2016-01-17 DIAGNOSIS — R000 Tachycardia, unspecified: Secondary | ICD-10-CM

## 2016-01-17 DIAGNOSIS — R42 Dizziness and giddiness: Secondary | ICD-10-CM | POA: Diagnosis not present

## 2016-01-17 DIAGNOSIS — R262 Difficulty in walking, not elsewhere classified: Secondary | ICD-10-CM

## 2016-01-17 DIAGNOSIS — R413 Other amnesia: Secondary | ICD-10-CM

## 2016-01-17 DIAGNOSIS — R2689 Other abnormalities of gait and mobility: Secondary | ICD-10-CM

## 2016-02-15 ENCOUNTER — Encounter: Payer: Self-pay | Admitting: Podiatry

## 2016-02-15 ENCOUNTER — Ambulatory Visit (INDEPENDENT_AMBULATORY_CARE_PROVIDER_SITE_OTHER): Payer: Commercial Managed Care - HMO | Admitting: Podiatry

## 2016-02-15 VITALS — BP 166/98 | HR 101 | Resp 12

## 2016-02-15 DIAGNOSIS — M2042 Other hammer toe(s) (acquired), left foot: Secondary | ICD-10-CM

## 2016-02-15 DIAGNOSIS — L600 Ingrowing nail: Secondary | ICD-10-CM

## 2016-02-15 NOTE — Patient Instructions (Signed)
Today your diabetic foot screen demonstrated adequate pulsations into your feet. There is slight decreased feeling in your feet The size of the toenail area third, fourth left toes are uncomfortable most likely from the pressure of the hammertoe deformity pressing on the edge of the nail Today I trim the edges of your nails out and recommended you apply Vaseline to the areas If the pain continues and you do not contact relief contact office for evaluation by Dr. Mayo Ao was in our office  Diabetes and Foot Care Diabetes may cause you to have problems because of poor blood supply (circulation) to your feet and legs. This may cause the skin on your feet to become thinner, break easier, and heal more slowly. Your skin may become dry, and the skin may peel and crack. You may also have nerve damage in your legs and feet causing decreased feeling in them. You may not notice minor injuries to your feet that could lead to infections or more serious problems. Taking care of your feet is one of the most important things you can do for yourself.  HOME CARE INSTRUCTIONS  Wear shoes at all times, even in the house. Do not go barefoot. Bare feet are easily injured.  Check your feet daily for blisters, cuts, and redness. If you cannot see the bottom of your feet, use a mirror or ask someone for help.  Wash your feet with warm water (do not use hot water) and mild soap. Then pat your feet and the areas between your toes until they are completely dry. Do not soak your feet as this can dry your skin.  Apply a moisturizing lotion or petroleum jelly (that does not contain alcohol and is unscented) to the skin on your feet and to dry, brittle toenails. Do not apply lotion between your toes.  Trim your toenails straight across. Do not dig under them or around the cuticle. File the edges of your nails with an emery board or nail file.  Do not cut corns or calluses or try to remove them with medicine.  Wear  clean socks or stockings every day. Make sure they are not too tight. Do not wear knee-high stockings since they may decrease blood flow to your legs.  Wear shoes that fit properly and have enough cushioning. To break in new shoes, wear them for just a few hours a day. This prevents you from injuring your feet. Always look in your shoes before you put them on to be sure there are no objects inside.  Do not cross your legs. This may decrease the blood flow to your feet.  If you find a minor scrape, cut, or break in the skin on your feet, keep it and the skin around it clean and dry. These areas may be cleansed with mild soap and water. Do not cleanse the area with peroxide, alcohol, or iodine.  When you remove an adhesive bandage, be sure not to damage the skin around it.  If you have a wound, look at it several times a day to make sure it is healing.  Do not use heating pads or hot water bottles. They may burn your skin. If you have lost feeling in your feet or legs, you may not know it is happening until it is too late.  Make sure your health care provider performs a complete foot exam at least annually or more often if you have foot problems. Report any cuts, sores, or bruises to your health  care provider immediately. SEEK MEDICAL CARE IF:   You have an injury that is not healing.  You have cuts or breaks in the skin.  You have an ingrown nail.  You notice redness on your legs or feet.  You feel burning or tingling in your legs or feet.  You have pain or cramps in your legs and feet.  Your legs or feet are numb.  Your feet always feel cold. SEEK IMMEDIATE MEDICAL CARE IF:   There is increasing redness, swelling, or pain in or around a wound.  There is a red line that goes up your leg.  Pus is coming from a wound.  You develop a fever or as directed by your health care provider.  You notice a bad smell coming from an ulcer or wound.   This information is not intended to  replace advice given to you by your health care provider. Make sure you discuss any questions you have with your health care provider.   Document Released: 11/23/2000 Document Revised: 07/29/2013 Document Reviewed: 05/05/2013 Elsevier Interactive Patient Education [employer].

## 2016-02-16 NOTE — Progress Notes (Signed)
Patient ID: Anthony Tewalt Sr., male   DOB: [birth year], 75 y.o.   MRN: [id number]  Subjective: This patient presents today complaining of discomfort in the nail area of the third and fourth left toes for the past month. Area hurts with direct shoe pressure and walking. Patient is undergone previous phenol matricectomy to these areas by Dr. Larry Sierras 10 and 2016. Patient is a prediabetic per patient on metformin  He denies any history of ulceration, claudication, amputated  Vascular: DP and PT pulses 2/4 bilaterally Capillary reflex immediate bilaterally  Neurological: Sensation to 10 g monofilament wire intact 4/5 bilaterally Vibratory sensation reactive right nonreactive left Ankle reflex equal and reactive bilaterally  Dermatological: No open skin lesions bilaterally The lateral borders of the third and fourth left toes appear to have been removed previously without any obvious regrowth The lateral borders of third left toes are tender direct lateral pressure There is no erythema, edema, drainage, warmth noted in the third and fourth left toes surrounding the nails  Musculoskeletal: The lateral toes right and left feet are somewhat elongated with mallet toe and mild contracture the PIPJ and DIPJ which tend to put pressure on the lateral aspect and third fourth left toenails  Assessment: Satisfactory neurovascular status 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 I debrided the lateral borders of third and left toenails slightly further without any bleeding. I recommended patient apply Vaseline to these areas to soften the lateral margins.  I advised patient if the symptoms persisted  after today's debridement to schedule a visit with Dr. Mayo Ao to further evaluate and treat as they wouldn't usually agree upon  Reappoint at patient's request

## 2016-03-01 DIAGNOSIS — C61 Malignant neoplasm of prostate: Secondary | ICD-10-CM | POA: Diagnosis not present

## 2016-03-06 DIAGNOSIS — R42 Dizziness and giddiness: Secondary | ICD-10-CM | POA: Diagnosis not present

## 2016-03-06 DIAGNOSIS — Z6828 Body mass index (BMI) 28.0-28.9, adult: Secondary | ICD-10-CM | POA: Diagnosis not present

## 2016-03-06 DIAGNOSIS — C61 Malignant neoplasm of prostate: Secondary | ICD-10-CM | POA: Diagnosis not present

## 2016-03-06 DIAGNOSIS — E1129 Type 2 diabetes mellitus with other diabetic kidney complication: Secondary | ICD-10-CM | POA: Diagnosis not present

## 2016-03-06 DIAGNOSIS — M545 Low back pain: Secondary | ICD-10-CM | POA: Diagnosis not present

## 2016-03-06 DIAGNOSIS — Z8669 Personal history of other diseases of the nervous system and sense organs: Secondary | ICD-10-CM | POA: Diagnosis not present

## 2016-03-06 DIAGNOSIS — I10 Essential (primary) hypertension: Secondary | ICD-10-CM | POA: Diagnosis not present

## 2016-03-07 DIAGNOSIS — N138 Other obstructive and reflux uropathy: Secondary | ICD-10-CM | POA: Diagnosis not present

## 2016-03-07 DIAGNOSIS — Z0000 Encounter for general adult medical examination without abnormal findings: Secondary | ICD-10-CM | POA: Diagnosis not present

## 2016-03-07 DIAGNOSIS — N5201 Erectile dysfunction due to arterial insufficiency: Secondary | ICD-10-CM | POA: Diagnosis not present

## 2016-03-07 DIAGNOSIS — C61 Malignant neoplasm of prostate: Secondary | ICD-10-CM | POA: Diagnosis not present

## 2016-03-07 DIAGNOSIS — N401 Enlarged prostate with lower urinary tract symptoms: Secondary | ICD-10-CM | POA: Diagnosis not present

## 2016-03-21 DIAGNOSIS — C61 Malignant neoplasm of prostate: Secondary | ICD-10-CM | POA: Diagnosis not present

## 2016-03-21 DIAGNOSIS — Z6827 Body mass index (BMI) 27.0-27.9, adult: Secondary | ICD-10-CM | POA: Diagnosis not present

## 2016-03-21 DIAGNOSIS — I10 Essential (primary) hypertension: Secondary | ICD-10-CM | POA: Diagnosis not present

## 2016-03-28 DIAGNOSIS — I10 Essential (primary) hypertension: Secondary | ICD-10-CM | POA: Diagnosis not present

## 2016-04-12 DIAGNOSIS — Z6827 Body mass index (BMI) 27.0-27.9, adult: Secondary | ICD-10-CM | POA: Diagnosis not present

## 2016-04-12 DIAGNOSIS — E784 Other hyperlipidemia: Secondary | ICD-10-CM | POA: Diagnosis not present

## 2016-04-12 DIAGNOSIS — H25813 Combined forms of age-related cataract, bilateral: Secondary | ICD-10-CM | POA: Diagnosis not present

## 2016-04-12 DIAGNOSIS — I10 Essential (primary) hypertension: Secondary | ICD-10-CM | POA: Diagnosis not present

## 2016-04-12 DIAGNOSIS — F419 Anxiety disorder, unspecified: Secondary | ICD-10-CM | POA: Diagnosis not present

## 2016-04-12 DIAGNOSIS — H401133 Primary open-angle glaucoma, bilateral, severe stage: Secondary | ICD-10-CM | POA: Diagnosis not present

## 2016-05-24 DIAGNOSIS — M19072 Primary osteoarthritis, left ankle and foot: Secondary | ICD-10-CM | POA: Diagnosis not present

## 2016-05-24 DIAGNOSIS — M19071 Primary osteoarthritis, right ankle and foot: Secondary | ICD-10-CM | POA: Diagnosis not present

## 2016-05-24 DIAGNOSIS — M25774 Osteophyte, right foot: Secondary | ICD-10-CM | POA: Diagnosis not present

## 2016-08-08 DIAGNOSIS — B360 Pityriasis versicolor: Secondary | ICD-10-CM | POA: Diagnosis not present

## 2016-09-12 DIAGNOSIS — C61 Malignant neoplasm of prostate: Secondary | ICD-10-CM | POA: Diagnosis not present

## 2016-10-26 DIAGNOSIS — H25813 Combined forms of age-related cataract, bilateral: Secondary | ICD-10-CM | POA: Diagnosis not present

## 2016-10-26 DIAGNOSIS — E119 Type 2 diabetes mellitus without complications: Secondary | ICD-10-CM | POA: Diagnosis not present

## 2016-10-26 DIAGNOSIS — H401133 Primary open-angle glaucoma, bilateral, severe stage: Secondary | ICD-10-CM | POA: Diagnosis not present

## 2016-11-08 DIAGNOSIS — H401133 Primary open-angle glaucoma, bilateral, severe stage: Secondary | ICD-10-CM | POA: Diagnosis not present

## 2016-11-08 DIAGNOSIS — H25813 Combined forms of age-related cataract, bilateral: Secondary | ICD-10-CM | POA: Diagnosis not present

## 2016-11-29 DIAGNOSIS — H401123 Primary open-angle glaucoma, left eye, severe stage: Secondary | ICD-10-CM | POA: Diagnosis not present

## 2017-03-22 DIAGNOSIS — H401123 Primary open-angle glaucoma, left eye, severe stage: Secondary | ICD-10-CM | POA: Diagnosis not present

## 2017-03-22 DIAGNOSIS — H401113 Primary open-angle glaucoma, right eye, severe stage: Secondary | ICD-10-CM | POA: Diagnosis not present

## 2017-03-28 DIAGNOSIS — C61 Malignant neoplasm of prostate: Secondary | ICD-10-CM | POA: Diagnosis not present

## 2017-04-03 DIAGNOSIS — C61 Malignant neoplasm of prostate: Secondary | ICD-10-CM | POA: Diagnosis not present

## 2017-04-09 DIAGNOSIS — H401113 Primary open-angle glaucoma, right eye, severe stage: Secondary | ICD-10-CM | POA: Diagnosis not present

## 2017-04-09 DIAGNOSIS — H2513 Age-related nuclear cataract, bilateral: Secondary | ICD-10-CM | POA: Diagnosis not present

## 2017-04-30 DIAGNOSIS — Z7982 Long term (current) use of aspirin: Secondary | ICD-10-CM | POA: Diagnosis not present

## 2017-04-30 DIAGNOSIS — Z6828 Body mass index (BMI) 28.0-28.9, adult: Secondary | ICD-10-CM | POA: Diagnosis not present

## 2017-04-30 DIAGNOSIS — Z0000 Encounter for general adult medical examination without abnormal findings: Secondary | ICD-10-CM | POA: Diagnosis not present

## 2017-04-30 DIAGNOSIS — Z7984 Long term (current) use of oral hypoglycemic drugs: Secondary | ICD-10-CM | POA: Diagnosis not present

## 2017-04-30 DIAGNOSIS — R7303 Prediabetes: Secondary | ICD-10-CM | POA: Diagnosis not present

## 2017-04-30 DIAGNOSIS — L600 Ingrowing nail: Secondary | ICD-10-CM | POA: Diagnosis not present

## 2017-04-30 DIAGNOSIS — Z791 Long term (current) use of non-steroidal anti-inflammatories (NSAID): Secondary | ICD-10-CM | POA: Diagnosis not present

## 2017-04-30 DIAGNOSIS — H259 Unspecified age-related cataract: Secondary | ICD-10-CM | POA: Diagnosis not present

## 2017-04-30 DIAGNOSIS — E663 Overweight: Secondary | ICD-10-CM | POA: Diagnosis not present

## 2017-04-30 DIAGNOSIS — Z87891 Personal history of nicotine dependence: Secondary | ICD-10-CM | POA: Diagnosis not present

## 2017-04-30 DIAGNOSIS — G4701 Insomnia due to medical condition: Secondary | ICD-10-CM | POA: Diagnosis not present

## 2017-04-30 DIAGNOSIS — K08109 Complete loss of teeth, unspecified cause, unspecified class: Secondary | ICD-10-CM | POA: Diagnosis not present

## 2017-04-30 DIAGNOSIS — Z8673 Personal history of transient ischemic attack (TIA), and cerebral infarction without residual deficits: Secondary | ICD-10-CM | POA: Diagnosis not present

## 2017-04-30 DIAGNOSIS — Z79899 Other long term (current) drug therapy: Secondary | ICD-10-CM | POA: Diagnosis not present

## 2017-04-30 DIAGNOSIS — M5432 Sciatica, left side: Secondary | ICD-10-CM | POA: Diagnosis not present

## 2017-04-30 DIAGNOSIS — I10 Essential (primary) hypertension: Secondary | ICD-10-CM | POA: Diagnosis not present

## 2017-04-30 DIAGNOSIS — H409 Unspecified glaucoma: Secondary | ICD-10-CM | POA: Diagnosis not present

## 2017-04-30 DIAGNOSIS — G40909 Epilepsy, unspecified, not intractable, without status epilepticus: Secondary | ICD-10-CM | POA: Diagnosis not present

## 2017-04-30 DIAGNOSIS — E785 Hyperlipidemia, unspecified: Secondary | ICD-10-CM | POA: Diagnosis not present

## 2017-05-17 DIAGNOSIS — M25571 Pain in right ankle and joints of right foot: Secondary | ICD-10-CM | POA: Diagnosis not present

## 2017-05-17 DIAGNOSIS — Z6827 Body mass index (BMI) 27.0-27.9, adult: Secondary | ICD-10-CM | POA: Diagnosis not present

## 2017-05-17 DIAGNOSIS — E1129 Type 2 diabetes mellitus with other diabetic kidney complication: Secondary | ICD-10-CM | POA: Diagnosis not present

## 2017-05-17 DIAGNOSIS — I10 Essential (primary) hypertension: Secondary | ICD-10-CM | POA: Diagnosis not present

## 2017-05-17 DIAGNOSIS — N481 Balanitis: Secondary | ICD-10-CM | POA: Diagnosis not present

## 2017-05-17 DIAGNOSIS — Z1389 Encounter for screening for other disorder: Secondary | ICD-10-CM | POA: Diagnosis not present

## 2017-05-17 DIAGNOSIS — C61 Malignant neoplasm of prostate: Secondary | ICD-10-CM | POA: Diagnosis not present

## 2017-05-30 DIAGNOSIS — H524 Presbyopia: Secondary | ICD-10-CM | POA: Diagnosis not present

## 2017-05-30 DIAGNOSIS — H401133 Primary open-angle glaucoma, bilateral, severe stage: Secondary | ICD-10-CM | POA: Diagnosis not present

## 2017-05-30 DIAGNOSIS — H25813 Combined forms of age-related cataract, bilateral: Secondary | ICD-10-CM | POA: Diagnosis not present

## 2017-06-06 DIAGNOSIS — M76821 Posterior tibial tendinitis, right leg: Secondary | ICD-10-CM | POA: Diagnosis not present

## 2017-06-06 DIAGNOSIS — M25571 Pain in right ankle and joints of right foot: Secondary | ICD-10-CM | POA: Diagnosis not present

## 2017-06-06 DIAGNOSIS — G8929 Other chronic pain: Secondary | ICD-10-CM | POA: Diagnosis not present

## 2017-06-07 DIAGNOSIS — Z0101 Encounter for examination of eyes and vision with abnormal findings: Secondary | ICD-10-CM | POA: Diagnosis not present

## 2017-06-14 DIAGNOSIS — M25571 Pain in right ankle and joints of right foot: Secondary | ICD-10-CM | POA: Diagnosis not present

## 2017-06-26 DIAGNOSIS — M2041 Other hammer toe(s) (acquired), right foot: Secondary | ICD-10-CM | POA: Diagnosis not present

## 2017-06-26 DIAGNOSIS — M2042 Other hammer toe(s) (acquired), left foot: Secondary | ICD-10-CM | POA: Diagnosis not present

## 2017-09-16 DIAGNOSIS — R69 Illness, unspecified: Secondary | ICD-10-CM | POA: Diagnosis not present

## 2017-09-23 DIAGNOSIS — C61 Malignant neoplasm of prostate: Secondary | ICD-10-CM | POA: Diagnosis not present

## 2017-09-30 DIAGNOSIS — C61 Malignant neoplasm of prostate: Secondary | ICD-10-CM | POA: Diagnosis not present

## 2017-10-10 DIAGNOSIS — H524 Presbyopia: Secondary | ICD-10-CM | POA: Diagnosis not present

## 2017-10-10 DIAGNOSIS — H401133 Primary open-angle glaucoma, bilateral, severe stage: Secondary | ICD-10-CM | POA: Diagnosis not present

## 2017-10-10 DIAGNOSIS — H25813 Combined forms of age-related cataract, bilateral: Secondary | ICD-10-CM | POA: Diagnosis not present

## 2017-11-08 DIAGNOSIS — Z125 Encounter for screening for malignant neoplasm of prostate: Secondary | ICD-10-CM | POA: Diagnosis not present

## 2017-11-08 DIAGNOSIS — I10 Essential (primary) hypertension: Secondary | ICD-10-CM | POA: Diagnosis not present

## 2017-11-08 DIAGNOSIS — E7849 Other hyperlipidemia: Secondary | ICD-10-CM | POA: Diagnosis not present

## 2017-11-08 DIAGNOSIS — R82998 Other abnormal findings in urine: Secondary | ICD-10-CM | POA: Diagnosis not present

## 2017-11-08 DIAGNOSIS — E1129 Type 2 diabetes mellitus with other diabetic kidney complication: Secondary | ICD-10-CM | POA: Diagnosis not present

## 2017-11-15 DIAGNOSIS — M545 Low back pain: Secondary | ICD-10-CM | POA: Diagnosis not present

## 2017-11-15 DIAGNOSIS — C61 Malignant neoplasm of prostate: Secondary | ICD-10-CM | POA: Diagnosis not present

## 2017-11-15 DIAGNOSIS — E1129 Type 2 diabetes mellitus with other diabetic kidney complication: Secondary | ICD-10-CM | POA: Diagnosis not present

## 2017-11-15 DIAGNOSIS — R808 Other proteinuria: Secondary | ICD-10-CM | POA: Diagnosis not present

## 2017-11-15 DIAGNOSIS — Z0000 Encounter for general adult medical examination without abnormal findings: Secondary | ICD-10-CM | POA: Diagnosis not present

## 2017-11-15 DIAGNOSIS — H4089 Other specified glaucoma: Secondary | ICD-10-CM | POA: Diagnosis not present

## 2017-11-15 DIAGNOSIS — E7849 Other hyperlipidemia: Secondary | ICD-10-CM | POA: Diagnosis not present

## 2017-11-15 DIAGNOSIS — R69 Illness, unspecified: Secondary | ICD-10-CM | POA: Diagnosis not present

## 2017-11-15 DIAGNOSIS — E1121 Type 2 diabetes mellitus with diabetic nephropathy: Secondary | ICD-10-CM | POA: Diagnosis not present

## 2018-01-13 DIAGNOSIS — E139 Other specified diabetes mellitus without complications: Secondary | ICD-10-CM | POA: Diagnosis not present

## 2018-01-13 DIAGNOSIS — M2042 Other hammer toe(s) (acquired), left foot: Secondary | ICD-10-CM | POA: Diagnosis not present

## 2018-01-13 DIAGNOSIS — M2041 Other hammer toe(s) (acquired), right foot: Secondary | ICD-10-CM | POA: Diagnosis not present

## 2018-01-13 DIAGNOSIS — M25571 Pain in right ankle and joints of right foot: Secondary | ICD-10-CM | POA: Diagnosis not present

## 2018-01-17 DIAGNOSIS — G40909 Epilepsy, unspecified, not intractable, without status epilepticus: Secondary | ICD-10-CM | POA: Diagnosis not present

## 2018-01-17 DIAGNOSIS — Z7982 Long term (current) use of aspirin: Secondary | ICD-10-CM | POA: Diagnosis not present

## 2018-01-17 DIAGNOSIS — I10 Essential (primary) hypertension: Secondary | ICD-10-CM | POA: Diagnosis not present

## 2018-01-17 DIAGNOSIS — H409 Unspecified glaucoma: Secondary | ICD-10-CM | POA: Diagnosis not present

## 2018-01-17 DIAGNOSIS — K08409 Partial loss of teeth, unspecified cause, unspecified class: Secondary | ICD-10-CM | POA: Diagnosis not present

## 2018-01-17 DIAGNOSIS — R609 Edema, unspecified: Secondary | ICD-10-CM | POA: Diagnosis not present

## 2018-01-17 DIAGNOSIS — G8929 Other chronic pain: Secondary | ICD-10-CM | POA: Diagnosis not present

## 2018-01-17 DIAGNOSIS — C61 Malignant neoplasm of prostate: Secondary | ICD-10-CM | POA: Diagnosis not present

## 2018-01-17 DIAGNOSIS — E1140 Type 2 diabetes mellitus with diabetic neuropathy, unspecified: Secondary | ICD-10-CM | POA: Diagnosis not present

## 2018-01-17 DIAGNOSIS — R42 Dizziness and giddiness: Secondary | ICD-10-CM | POA: Diagnosis not present

## 2018-01-28 DIAGNOSIS — Z01818 Encounter for other preprocedural examination: Secondary | ICD-10-CM | POA: Diagnosis not present

## 2018-01-28 DIAGNOSIS — E139 Other specified diabetes mellitus without complications: Secondary | ICD-10-CM | POA: Diagnosis not present

## 2018-01-28 DIAGNOSIS — M2042 Other hammer toe(s) (acquired), left foot: Secondary | ICD-10-CM | POA: Diagnosis not present

## 2018-01-28 DIAGNOSIS — M25571 Pain in right ankle and joints of right foot: Secondary | ICD-10-CM | POA: Diagnosis not present

## 2018-02-03 DIAGNOSIS — M2042 Other hammer toe(s) (acquired), left foot: Secondary | ICD-10-CM | POA: Diagnosis not present

## 2018-02-03 MED FILL — HYDROCODON-APAP 5-325: 5-325 | 4 days supply | Qty: 20 | Fill #0

## 2018-02-07 DIAGNOSIS — M2042 Other hammer toe(s) (acquired), left foot: Secondary | ICD-10-CM | POA: Diagnosis not present

## 2018-03-17 DIAGNOSIS — E1129 Type 2 diabetes mellitus with other diabetic kidney complication: Secondary | ICD-10-CM | POA: Diagnosis not present

## 2018-03-17 DIAGNOSIS — J019 Acute sinusitis, unspecified: Secondary | ICD-10-CM | POA: Diagnosis not present

## 2018-03-17 DIAGNOSIS — Z6822 Body mass index (BMI) 22.0-22.9, adult: Secondary | ICD-10-CM | POA: Diagnosis not present

## 2018-03-17 DIAGNOSIS — E7849 Other hyperlipidemia: Secondary | ICD-10-CM | POA: Diagnosis not present

## 2018-03-17 DIAGNOSIS — C61 Malignant neoplasm of prostate: Secondary | ICD-10-CM | POA: Diagnosis not present

## 2018-03-17 DIAGNOSIS — I10 Essential (primary) hypertension: Secondary | ICD-10-CM | POA: Diagnosis not present

## 2018-03-17 DIAGNOSIS — Z1389 Encounter for screening for other disorder: Secondary | ICD-10-CM | POA: Diagnosis not present

## 2018-03-26 DIAGNOSIS — H401133 Primary open-angle glaucoma, bilateral, severe stage: Secondary | ICD-10-CM | POA: Diagnosis not present

## 2018-03-26 DIAGNOSIS — H25813 Combined forms of age-related cataract, bilateral: Secondary | ICD-10-CM | POA: Diagnosis not present

## 2018-04-25 DIAGNOSIS — C61 Malignant neoplasm of prostate: Secondary | ICD-10-CM | POA: Diagnosis not present

## 2018-05-02 DIAGNOSIS — C61 Malignant neoplasm of prostate: Secondary | ICD-10-CM | POA: Diagnosis not present

## 2018-05-02 DIAGNOSIS — N401 Enlarged prostate with lower urinary tract symptoms: Secondary | ICD-10-CM | POA: Diagnosis not present

## 2018-05-02 DIAGNOSIS — R350 Frequency of micturition: Secondary | ICD-10-CM | POA: Diagnosis not present

## 2018-07-07 DIAGNOSIS — I10 Essential (primary) hypertension: Secondary | ICD-10-CM | POA: Diagnosis not present

## 2018-07-07 DIAGNOSIS — E1129 Type 2 diabetes mellitus with other diabetic kidney complication: Secondary | ICD-10-CM | POA: Diagnosis not present

## 2018-07-07 DIAGNOSIS — Z6827 Body mass index (BMI) 27.0-27.9, adult: Secondary | ICD-10-CM | POA: Diagnosis not present

## 2018-07-09 DIAGNOSIS — R69 Illness, unspecified: Secondary | ICD-10-CM | POA: Diagnosis not present

## 2018-07-30 DIAGNOSIS — R69 Illness, unspecified: Secondary | ICD-10-CM | POA: Diagnosis not present

## 2018-08-12 ENCOUNTER — Other Ambulatory Visit: Payer: Self-pay | Admitting: Urology

## 2018-08-12 DIAGNOSIS — C61 Malignant neoplasm of prostate: Secondary | ICD-10-CM

## 2018-09-25 DIAGNOSIS — H401133 Primary open-angle glaucoma, bilateral, severe stage: Secondary | ICD-10-CM | POA: Diagnosis not present

## 2018-09-25 DIAGNOSIS — H25813 Combined forms of age-related cataract, bilateral: Secondary | ICD-10-CM | POA: Diagnosis not present

## 2018-09-25 DIAGNOSIS — E119 Type 2 diabetes mellitus without complications: Secondary | ICD-10-CM | POA: Diagnosis not present

## 2018-10-03 ENCOUNTER — Other Ambulatory Visit: Payer: Commercial Managed Care - HMO

## 2018-10-15 DIAGNOSIS — C61 Malignant neoplasm of prostate: Secondary | ICD-10-CM | POA: Diagnosis not present

## 2018-10-23 ENCOUNTER — Ambulatory Visit
Admission: RE | Admit: 2018-10-23 | Discharge: 2018-10-23 | Disposition: A | Discharge status: Home / Self Care | Payer: 59 | Source: Ambulatory Visit | Attending: Urology | Admitting: Urology

## 2018-10-23 DIAGNOSIS — C61 Malignant neoplasm of prostate: Secondary | ICD-10-CM | POA: Diagnosis not present

## 2018-10-23 MED ORDER — GADOBENATE DIMEGLUMINE 529 MG/ML IV SOLN
19.0000 mL | Freq: Once | INTRAVENOUS | Status: AC | PRN
  Administered 2018-10-23: 19 mL via INTRAVENOUS

## 2018-10-30 DIAGNOSIS — E1121 Type 2 diabetes mellitus with diabetic nephropathy: Secondary | ICD-10-CM | POA: Diagnosis not present

## 2018-10-30 DIAGNOSIS — I10 Essential (primary) hypertension: Secondary | ICD-10-CM | POA: Diagnosis not present

## 2018-10-30 DIAGNOSIS — E1129 Type 2 diabetes mellitus with other diabetic kidney complication: Secondary | ICD-10-CM | POA: Diagnosis not present

## 2018-10-30 DIAGNOSIS — M545 Low back pain: Secondary | ICD-10-CM | POA: Diagnosis not present

## 2018-10-30 DIAGNOSIS — Z6827 Body mass index (BMI) 27.0-27.9, adult: Secondary | ICD-10-CM | POA: Diagnosis not present

## 2018-10-30 DIAGNOSIS — C61 Malignant neoplasm of prostate: Secondary | ICD-10-CM | POA: Diagnosis not present

## 2018-11-14 DIAGNOSIS — R69 Illness, unspecified: Secondary | ICD-10-CM | POA: Diagnosis not present

## 2018-11-19 DIAGNOSIS — C61 Malignant neoplasm of prostate: Secondary | ICD-10-CM | POA: Diagnosis not present

## 2018-12-29 ENCOUNTER — Encounter: Payer: Self-pay | Admitting: Radiation Oncology

## 2018-12-29 DIAGNOSIS — C61 Malignant neoplasm of prostate: Secondary | ICD-10-CM | POA: Insufficient documentation

## 2018-12-29 NOTE — Progress Notes (Addendum)
GU Location of Tumor / Histology: prostatic adenocarcinoma  If Prostate Cancer, Gleason Score is (4 + 3) and PSA is (8.37) on 10/15/2018. Prostate volume: 40.81 cc.   Anthony Hartshorn Sr. had an initial prostate biopsy in 2014 which revealed gleason 3+3 in two cores. Patient opted for active surveillance at that time.   Biopsies of prostate (if applicable) revealed:    Past/Anticipated interventions by urology, if any: prostate biopsy, prostate biosy, prostate MRI biopsy, referral for consideration of brachytherapy or EBRT.  Past/Anticipated interventions by medical oncology, if any: no  Weight changes, if any: no  Bowel/Bladder complaints, if any: IPSS 11. SHIM 21. Denies dysuria or hematuria. Denies urinary leakage or incontinence.  Reports diuretics effect his LUTS.  Nausea/Vomiting, if any: no  Pain issues, if any:  Left side sciatic nerve pain and left foot pain s/p surgery.   SAFETY ISSUES:  Prior radiation? no  Pacemaker/ICD? no  Possible current pregnancy? no, male patient  Is the patient on methotrexate? no  Current Complaints / other details:  78 year old male. Divorced.  Retired Administrator. No bone scan or CT done since MRI was negative. Resides in [place]. Younger brother in the [employer] who is receiving radiation for advanced prostate cancer. Reports his 92 year old granddaughter had a stroke 2 months ago. Reports the stress of his dx and his family members dx is wearing him thin. Social work referral made.  Enjoys body building.

## 2018-12-30 ENCOUNTER — Ambulatory Visit
Admission: RE | Admit: 2018-12-30 | Discharge: 2018-12-30 | Disposition: A | Discharge status: Home / Self Care | Payer: Medicare Other | Source: Ambulatory Visit | Attending: Radiation Oncology | Admitting: Radiation Oncology

## 2018-12-30 ENCOUNTER — Other Ambulatory Visit: Payer: Self-pay

## 2018-12-30 ENCOUNTER — Encounter: Payer: Self-pay | Admitting: Medical Oncology

## 2018-12-30 ENCOUNTER — Encounter: Payer: Self-pay | Admitting: Radiation Oncology

## 2018-12-30 DIAGNOSIS — M1990 Unspecified osteoarthritis, unspecified site: Secondary | ICD-10-CM | POA: Insufficient documentation

## 2018-12-30 DIAGNOSIS — Z7984 Long term (current) use of oral hypoglycemic drugs: Secondary | ICD-10-CM | POA: Insufficient documentation

## 2018-12-30 DIAGNOSIS — I10 Essential (primary) hypertension: Secondary | ICD-10-CM | POA: Insufficient documentation

## 2018-12-30 DIAGNOSIS — C61 Malignant neoplasm of prostate: Secondary | ICD-10-CM

## 2018-12-30 DIAGNOSIS — Z87891 Personal history of nicotine dependence: Secondary | ICD-10-CM | POA: Insufficient documentation

## 2018-12-30 DIAGNOSIS — E119 Type 2 diabetes mellitus without complications: Secondary | ICD-10-CM | POA: Insufficient documentation

## 2018-12-30 DIAGNOSIS — Z79899 Other long term (current) drug therapy: Secondary | ICD-10-CM | POA: Diagnosis not present

## 2018-12-30 NOTE — Progress Notes (Signed)
Introduced myself to Anthony Walters as the prostate nurse navigator and my role. He is quite tearful and states he has a lot going on in his life. He states his brother is being treated for prostate cancer, his granddaughter had a stroke and now he has been diagnosed with prostate cancer. He has 4 children that are very supportive, one being a Marine scientist in [place]. I allowed him to share his feelings and fears. Anthony Walters is making a referral to social work. He states that Anthony Walters and he discussed treatment plans and he is most interested in brachytherapy. He loves to body build which is a good stress reliever for him. I gave him my business card and asked him to call me with questions or concerns. He voiced understanding.

## 2018-12-30 NOTE — Progress Notes (Signed)
See progress note under physician encounter. 

## 2018-12-30 NOTE — Progress Notes (Signed)
Radiation Oncology         [phone]) [phone] ________________________________  Initial outpatient Consultation  Name: Anthony Hartshorn Sr. MRN: [id number]  Date: 12/30/2018  DOB: 1941-08-19  [id number], Quillian Quince, MD  Franchot Gallo, MD   REFERRING PHYSICIAN: Franchot Gallo, MD  DIAGNOSIS: 78 y.o. gentleman with Stage T1c adenocarcinoma of the prostate with Gleason score of 4+3, and PSA of 8.37.    ICD-10-CM   1. Malignant neoplasm of prostate ([place]) C61     HISTORY OF PRESENT ILLNESS: Anthony Micale Sr. is a 78 y.o. male with a diagnosis of prostate cancer. He is an established patient with [facility], followed for elevated PSA since 2013. He has undergone two previous biopsies: the first on 10/09/2013 showing two cores of Gleason 3+3 with PSA 6.5 and the second on 12/17/2014 showing atypical cells only, PSA at that time was 4.89. He opted for active surveillance and has been followed closely since that time. His PSA was noted to be further elevated at 7.53 on 09/23/17 but DRE remained unconcerning.  The PSA was repeated on 04/25/18 and was further elevated at 9.13 and 8.37 on 10/15/18.  Therefore, as part of his active surveillance, he underwent prostate MRI on 10/23/2018, with results showing no evidence of high-grade carcinoma, lymphadenopathy, seminal vesicle involvement or distant metastatic disease. The patient proceeded to transrectal ultrasound with 12 biopsies of the prostate on 11/19/2018.  The prostate volume measured 40.81 cc.  Out of 12 core biopsies, 3 were positive.  The maximum Gleason score was 4+3, and this was seen in right mid lateral. Additionally, Gleason 3+4 was seen in left apex with perineural invasion and Gleason 3+3 was seen in right base lateral.   Of note, he has a brother that is being treated for high risk prostate cancer through the [facility].  The patient reviewed the biopsy results with his urologist and he has kindly been referred today for discussion of  potential radiation treatment options.   PREVIOUS RADIATION THERAPY: No  PAST MEDICAL HISTORY:  Past Medical History:  Diagnosis Date  . Adenocarcinoma of prostate ([place]) 10/14  . Arthritis   . Diabetes mellitus without complication ([place])   . Glaucoma   . Hyperlipidemia   . Hypertension   . Seizures ([place])   . Stroke ([place])   . TIA (transient ischemic attack)   . Tubular adenoma of colon 10/2010      PAST SURGICAL HISTORY: Past Surgical History:  Procedure Laterality Date  . COLONOSCOPY    . DENTAL SURGERY     implants  . LEFT EYE SURGERY    . MENISCUS REPAIR Right   . POLYPECTOMY    . PROSTATE BIOPSY    . TONSILLECTOMY    . VASECTOMY      FAMILY HISTORY:  Family History  Problem Relation Age of Onset  . Heart failure Mother   . Diabetes Mother   . Heart attack Father   . Colon cancer Neg Hx     SOCIAL HISTORY:  Social History   Socioeconomic History  . Marital status: Divorced    Spouse name: Not on file  . Number of children: 4  . Years of education: Not on file  . Highest education level: Not on file  Occupational History  . 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 Financial resource strain: Not on file  . Food insecurity:    Worry: Not on file    Inability: Not on file  . Transportation  needs:    Medical: Not on file    Non-medical: Not on file  Tobacco Use  . Smoking status: Former Smoker    Types: Cigars    Last attempt to quit: 09/10/1991    Years since quitting: 27.3  . Smokeless tobacco: Never Used  Substance and Sexual Activity  . Alcohol use: No    Alcohol/week: 0.0 standard drinks  . Drug use: No  . Sexual activity: Not on file  Lifestyle  . Physical activity:    Days per week: Not on file    Minutes per session: Not on file  . Stress: Not on file  Relationships  . Social connections:    Talks on phone: Not on file    Gets together: Not on file    Attends religious service: Not on file    Active member of  club or organization: Not on file    Attends meetings of clubs or organizations: Not on file    Relationship status: Not on file  . Intimate partner violence:    Fear of current or ex partner: Not on file    Emotionally abused: Not on file    Physically abused: Not on file    Forced sexual activity: Not on file  Other Topics Concern  . Not on file  Social History Narrative  . Not on file    ALLERGIES: Clindamycin/lincomycin; Grapefruit concentrate; Ace inhibitors; and Ciprofloxacin  MEDICATIONS:  Current Outpatient Medications  Medication Sig Dispense Refill  . aspirin EC 325 MG tablet Take 325 mg by mouth daily.    . bimatoprost (LUMIGAN) 0.01 % SOLN Place 1 drop into both eyes at bedtime.     . Brinzolamide-Brimonidine 1-0.2 % SUSP Apply 1 drop to eye 2 (two) times daily.    . cloNIDine (CATAPRES) 0.2 MG tablet Take 0.2 mg by mouth 3 (three) times daily.    Marland Kitchen gabapentin (NEURONTIN) 300 MG capsule TAKE 1 CAPSULE BY MOUTH TWICE DAILY (TO REDUCE PAIN)    . hydrochlorothiazide (HYDRODIURIL) 25 MG tablet Take 25 mg by mouth daily.    . metFORMIN (GLUMETZA) 500 MG (MOD) 24 hr tablet Take 500 mg by mouth daily with supper.    . metoprolol tartrate (LOPRESSOR) 50 MG tablet Take 50 mg by mouth 2 (two) times daily.    . verapamil (VERELAN PM) 360 MG 24 hr capsule Take 360 mg by mouth daily.    . famotidine (PEPCID) 20 MG tablet Take 1 tablet (20 mg total) by mouth 2 (two) times daily. (Patient not taking: Reported on 12/30/2018) 30 tablet 0  . gemfibrozil (LOPID) 600 MG tablet Take 600 mg by mouth 2 (two) times daily.    Marland Kitchen ipratropium (ATROVENT) 0.06 % nasal spray Place 2 sprays into both nostrils every 4 (four) hours as needed for rhinitis. (Patient not taking: Reported on 12/30/2018) 15 mL 1  . meloxicam (MOBIC) 15 MG tablet Take 15 mg by mouth daily as needed for pain.    Marland Kitchen nystatin cream (MYCOSTATIN)     . phenytoin (DILANTIN) 100 MG ER capsule Take 200 mg by mouth 2 (two) times daily.       No current facility-administered medications for this encounter.     REVIEW OF SYSTEMS:  On review of systems, the patient reports that he is doing well overall. He denies any chest pain, shortness of breath, cough, fevers, chills, night sweats, unintended weight changes. He denies any bowel disturbances, and denies abdominal pain, nausea or vomiting. He denies  any new musculoskeletal or joint aches or pains. His IPSS was 11, indicating moderate urinary symptoms. He reports diuretics affect his urinary symptoms. His SHIM was 21, indicating he does not have erectile dysfunction. A complete review of systems is obtained and is otherwise negative.    PHYSICAL EXAM:  Wt Readings from Last 3 Encounters:  12/30/18 198 lb (89.8 kg)  01/10/16 204 lb (92.5 kg)  11/30/15 197 lb (89.4 kg)   Temp Readings from Last 3 Encounters:  12/30/18 98.7 F (37.1 C) (Oral)  01/10/16 99.2 F (37.3 C) (Oral)  11/30/15 99.1 F (37.3 C) (Tympanic)   BP Readings from Last 3 Encounters:  12/30/18 (!) 176/94  02/15/16 (!) 166/98  01/10/16 173/98   Pulse Readings from Last 3 Encounters:  12/30/18 87  02/15/16 (!) 101  01/10/16 86   Pain Assessment Pain Score: 0-No pain/10  In general this is a well appearing African American in no acute distress. He is alert and oriented x4 and appropriate throughout the examination. HEENT reveals that the patient is normocephalic, atraumatic. EOMs are intact. PERRLA. Skin is intact without any evidence of gross lesions. Cardiovascular exam reveals a regular rate and rhythm, no clicks rubs or murmurs are auscultated. Chest is clear to auscultation bilaterally. Lymphatic assessment is performed and does not reveal any adenopathy in the cervical, supraclavicular, axillary, or inguinal chains. Abdomen has active bowel sounds in all quadrants and is intact. The abdomen is soft, non tender, non distended. Lower extremities are negative for pretibial pitting edema, deep calf  tenderness, cyanosis or clubbing.   KPS = 100  100 - Normal; no complaints; no evidence of disease. 90   - Able to carry on normal activity; minor signs or symptoms of disease. 80   - Normal activity with effort; some signs or symptoms of disease. 14   - Cares for self; unable to carry on normal activity or to do active work. 60   - Requires occasional assistance, but is able to care for most of his personal needs. 50   - Requires considerable assistance and frequent medical care. 72   - Disabled; requires special care and assistance. 38   - Severely disabled; hospital admission is indicated although death not imminent. 45   - Very sick; hospital admission necessary; active supportive treatment necessary. 10   - Moribund; fatal processes progressing rapidly. 0     - Dead  Karnofsky DA, Abelmann [place], Craver LS and Burchenal [facility] [phone]) The use of the nitrogen mustards in the palliative treatment of carcinoma: with particular reference to bronchogenic carcinoma Cancer 1 634-56  LABORATORY DATA:  Lab Results  Component Value Date   WBC 6.6 01/10/2016   HGB 14.0 01/10/2016   HCT 42.5 01/10/2016   MCV 89.5 01/10/2016   PLT 272 01/10/2016   Lab Results  Component Value Date   NA 139 01/10/2016   K 4.1 01/10/2016   CL 102 01/10/2016   CO2 27 01/10/2016   Lab Results  Component Value Date   ALT 11 (L) 01/10/2016   AST 20 01/10/2016   ALKPHOS 125 01/10/2016   BILITOT 0.4 01/10/2016     RADIOGRAPHY: No results found.    IMPRESSION/PLAN: 1. 78 y.o. gentleman with Stage T1c adenocarcinoma of the prostate with Gleason Score of 4+3, and PSA of 8.37. We discussed the patient's workup and outlined the nature of prostate cancer in this setting. The patient's T stage, Gleason's score, and PSA put him into the unfavorable intermediate risk group.  Accordingly, he is eligible for a variety of potential treatment options including brachytherapy or 5.5 weeks of external radiation. We discussed  the available radiation techniques, and focused on the details and logistics and delivery.  We discussed and outlined the risks, benefits, short and long-term effects associated with radiotherapy and compared and contrasted these with prostatectomy. We discussed the role of SpaceOAR in reducing the rectal toxicity associated with radiotherapy.   At the end of the conversation the patient is interested in moving forward with brachytherapy and use of SpaceOAR to reduce rectal toxicity from radiotherapy.  We will share our discussion with Dr. Diona Fanti and move forward with scheduling his CT [facility] planning appointment in the near future.  The patient met briefly with Romie Jumper in our office who will be working closely with him to coordinate OR scheduling and pre and post procedure appointments.  We will contact the pharmaceutical rep to ensure that [place] is available at the time of procedure.  He will have a prostate MRI following his post-seed CT SIM to confirm appropriate distribution of the [place].  We spent 60 minutes face to face with the patient and more than 50% of that time was spent in counseling and/or coordination of care.    [name], PA-C    Tyler Pita, MD  [facility] Direct Dial: [phone]  Fax: [phone] [email or web address]  Skype  [email or web address]   This document serves as a record of services personally performed by Tyler Pita, MD and Freeman Caldron, PA-C. It was created on their behalf by Wilburn Mylar, a trained medical scribe. The creation of this record is based on the scribe's personal observations and the provider's statements to them. This document has been checked and approved by the attending provider.

## 2019-01-01 ENCOUNTER — Encounter: Payer: Self-pay | Admitting: General Practice

## 2019-01-01 NOTE — Progress Notes (Signed)
[place] Psychosocial Distress Screening Clinical Social Work  Clinical Social Work was referred by distress screening protocol.  The patient scored a 9 on the Psychosocial Distress Thermometer which indicates severe distress. Clinical Social Worker contacted patient by phone to assess for distress and other psychosocial needs. States he had all his concerns addressed during recent clinic visit, felt reassured by answers provided by treatment team.  Has been in surveillance since 2014.  Has family member who has also recently been diagnosed w aggressive cancer, granddaughter had stroke.  Stressful family situations, feels responsible for caring for family members despite his own illness.  Relies on faith resources to cope.  Briefed on [employer] resources and how to contact CSW if needed.    ONCBCN DISTRESS SCREENING 12/30/2018  Screening Type Initial Screening  Distress experienced in past week (1-10) 9  Practical problem type Insurance  Emotional problem type Nervousness/Anxiety  Information Concerns Type Lack of info about treatment  Physical Problem type Skin dry/itchy  Physician notified of physical symptoms Yes  Referral to clinical psychology No  Referral to clinical social work Yes  Referral to dietition No  Referral to financial advocate Yes  Referral to support programs Yes  Referral to palliative care No    Clinical Social Worker follow up needed: No.  If yes, follow up plan:  Beverely Pace, [place], LCSW Clinical Social Worker Phone:  [phone]

## 2019-01-02 ENCOUNTER — Telehealth: Payer: Self-pay | Admitting: *Deleted

## 2019-01-02 NOTE — Telephone Encounter (Signed)
CALLED PATIENT TO INFORM OF PRE-SEED PLANNING CT FOR 01-22-19 - ARRIVAL TIME - 9 AM, SPOKE WITH PATIENT AND SHE IS AWARE OF THIS APPT.

## 2019-01-02 NOTE — Telephone Encounter (Signed)
XXXX 

## 2019-01-12 ENCOUNTER — Telehealth: Payer: Self-pay | Admitting: Medical Oncology

## 2019-01-12 NOTE — Telephone Encounter (Signed)
Anthony Walters called with concerns about a letter he received regarding insurance coverage. He is worried Dr. Tammi Klippel is out of net work. I asked him to read the letter and it is from [facility] with a general statement about being responsible for payment if physician out of network. He voiced understanding. He has question about CT simulation and pre-op visit on 2/13. All questions were answered and I encouraged him to call me with any other questions or concerns. He voiced understanding.

## 2019-01-21 ENCOUNTER — Telehealth: Payer: Self-pay | Admitting: *Deleted

## 2019-01-21 NOTE — Telephone Encounter (Signed)
CALLED PATIENT TO REMIND OF PRE-SEED APPT. AND CHEST X-RAY AND EKG FOR 01-22-19, LVM FOR A RETURN CALL

## 2019-01-22 ENCOUNTER — Ambulatory Visit
Admission: RE | Admit: 2019-01-22 | Discharge: 2019-01-22 | Disposition: A | Discharge status: Home / Self Care | Payer: Medicare Other | Source: Ambulatory Visit | Attending: Urology | Admitting: Urology

## 2019-01-22 ENCOUNTER — Ambulatory Visit (HOSPITAL_COMMUNITY)
Admission: RE | Admit: 2019-01-22 | Discharge: 2019-01-22 | Disposition: A | Discharge status: Home / Self Care | Payer: Medicare Other | Source: Ambulatory Visit | Attending: Urology | Admitting: Urology

## 2019-01-22 ENCOUNTER — Telehealth: Payer: Self-pay | Admitting: *Deleted

## 2019-01-22 ENCOUNTER — Ambulatory Visit: Payer: Medicare Other | Admitting: Radiation Oncology

## 2019-01-22 ENCOUNTER — Encounter: Payer: Self-pay | Admitting: Medical Oncology

## 2019-01-22 ENCOUNTER — Encounter (HOSPITAL_COMMUNITY)
Admission: RE | Admit: 2019-01-22 | Discharge: 2019-01-22 | Disposition: A | Discharge status: Home / Self Care | Payer: Medicare Other | Source: Ambulatory Visit

## 2019-01-22 ENCOUNTER — Encounter (HOSPITAL_COMMUNITY): Admission: RE | Admit: 2019-01-22 | Payer: Medicare Other | Source: Ambulatory Visit

## 2019-01-22 DIAGNOSIS — M1990 Unspecified osteoarthritis, unspecified site: Secondary | ICD-10-CM | POA: Insufficient documentation

## 2019-01-22 DIAGNOSIS — Z01818 Encounter for other preprocedural examination: Secondary | ICD-10-CM

## 2019-01-22 DIAGNOSIS — C61 Malignant neoplasm of prostate: Secondary | ICD-10-CM | POA: Insufficient documentation

## 2019-01-22 DIAGNOSIS — E119 Type 2 diabetes mellitus without complications: Secondary | ICD-10-CM | POA: Diagnosis not present

## 2019-01-22 DIAGNOSIS — Z79899 Other long term (current) drug therapy: Secondary | ICD-10-CM | POA: Diagnosis not present

## 2019-01-22 DIAGNOSIS — Z7984 Long term (current) use of oral hypoglycemic drugs: Secondary | ICD-10-CM | POA: Diagnosis not present

## 2019-01-22 DIAGNOSIS — I10 Essential (primary) hypertension: Secondary | ICD-10-CM | POA: Insufficient documentation

## 2019-01-22 DIAGNOSIS — Z87891 Personal history of nicotine dependence: Secondary | ICD-10-CM | POA: Diagnosis not present

## 2019-01-22 NOTE — Progress Notes (Signed)
  Radiation Oncology         [phone]) [phone] ________________________________  Name: Anthony Hartshorn Sr. MRN: [id number]  Date: 01/22/2019  DOB: 1941-11-02  SIMULATION AND TREATMENT PLANNING NOTE PUBIC ARCH STUDY  [id number], Quillian Quince, MD  [name], MD  DIAGNOSIS: 78 y.o. gentleman with Stage T1c adenocarcinoma of the prostate with Gleason score of 4+3, and PSA of 8.37.     ICD-10-CM   1. Malignant neoplasm of prostate ([place]) C61     COMPLEX SIMULATION:  The patient presented today for evaluation for possible prostate seed implant. He was brought to the radiation planning suite and placed supine on the CT couch. A 3-dimensional image study set was obtained in upload to the planning computer. There, on each axial slice, I contoured the prostate gland. Then, using three-dimensional radiation planning tools I reconstructed the prostate in view of the structures from the transperineal needle pathway to assess for possible pubic arch interference. In doing so, I did not appreciate any pubic arch interference. Also, the patient's prostate volume was estimated based on the drawn structure. The volume was 40.8 cc.  Given the pubic arch appearance and prostate volume, patient remains a good candidate to proceed with prostate seed implant. Today, he freely provided informed written consent to proceed.    PLAN: The patient will undergo prostate seed implant.   ________________________________  Sheral Apley. Tammi Klippel, M.D.

## 2019-01-22 NOTE — Telephone Encounter (Signed)
Called patient, lvm for a return call 

## 2019-01-22 NOTE — Progress Notes (Addendum)
Error

## 2019-02-24 ENCOUNTER — Other Ambulatory Visit: Payer: Self-pay | Admitting: Urology

## 2019-02-24 DIAGNOSIS — C61 Malignant neoplasm of prostate: Secondary | ICD-10-CM

## 2019-03-02 ENCOUNTER — Encounter: Payer: Self-pay | Admitting: Radiation Oncology

## 2019-03-02 NOTE — Progress Notes (Signed)
After checking with Ailene Ravel in Radiation if patient treatment qualifies for [facility] and Prostate grants, patient eligible to apply.  Patient brought proof of income on 02/27/19. Patient approved for the one-time $700 [place] and the one-time $200 Prostate grants. Patient given a copy of the approval letter as well as the expense sheet along with the Outpatient pharmacy information. Discussed in detail expenses and how they are covered.  Patient brought lease agreement today but states he will pay his own rent this month. Advised patient he will only need to bring statements which he wishes me to pay.  He has my card for any additional financial questions or concerns.

## 2019-03-19 ENCOUNTER — Inpatient Hospital Stay (HOSPITAL_COMMUNITY): Admission: RE | Admit: 2019-03-19 | Payer: Medicare Other | Source: Ambulatory Visit

## 2019-03-19 ENCOUNTER — Other Ambulatory Visit (HOSPITAL_COMMUNITY): Payer: Medicare Other

## 2019-03-26 ENCOUNTER — Encounter (HOSPITAL_COMMUNITY): Admission: RE | Payer: Self-pay | Source: Home / Self Care

## 2019-03-26 ENCOUNTER — Ambulatory Visit (HOSPITAL_COMMUNITY): Admission: RE | Admit: 2019-03-26 | Payer: Medicare Other | Source: Home / Self Care | Admitting: Urology

## 2019-03-26 SURGERY — INSERTION, RADIATION SOURCE, PROSTATE
Anesthesia: Choice

## 2019-04-01 ENCOUNTER — Other Ambulatory Visit: Payer: Self-pay | Admitting: Urology

## 2019-04-22 ENCOUNTER — Other Ambulatory Visit: Payer: Self-pay | Admitting: Urology

## 2019-04-23 MED ORDER — FLEET ENEMA 7-19 GM/118ML RE ENEM
1.0000 | ENEMA | Freq: Once | RECTAL | Status: DC
  Filled 2019-04-23: qty 1

## 2019-04-23 NOTE — Patient Instructions (Addendum)
Anthony Hartshorn Sr.     Your procedure is scheduled on: 04/27/19   Report to [facility] Main  Entrance and continue to Short Stay department and ring bell.arrive at 5:30 AM    YOU NEED TO HAVE A COVID 19 TEST ON_5/15______,  THIS TEST MUST BE DONE BEFORE SURGERY, COME TO [place] ENTRANCE BETWEEN THE HOURS OF 900 AM AND 300 PM ON YOUR COVID TEST DATE.   Call this number if you have problems the morning of surgery [phone]    Remember: Do not eat food or drink liquids              :After Midnight. BRUSH YOUR TEETH MORNING OF SURGERY AND RINSE YOUR MOUTH OUT, NO CHEWING GUM CANDY OR MINTS.     Take these medicines the morning of surgery with A SIP OF WATER:Clonidine(catapres), Metoprolol succinate(ToprolXL) Neurontin(gabapentin), and Dilantin  DO NOT TAKE ANY DIABETIC MEDICATIONS DAY OF YOUR SURGERY                               You may not have any metal on your body including               piercings  Do not wear jewelry, , lotions, powders or , deodorant               .              Men may shave face and neck.   Do not bring valuables to the hospital. [place].   Contacts, dentures or bridgework may not be worn into surgery.      Patients discharged the day of surgery will not be allowed to drive home . IF YOU ARE HAVING SURGERY AND GOING HOME THE SAME DAY, YOU MUST HAVE AN ADULT TO DRIVE YOU HOME AND BE WITH YOU FOR 24 HOURS. YOU MAY GO HOME BY TAXI OR UBER OR ORTHERWISE, BUT AN ADULT MUST ACCOMPANY YOU HOME AND STAY WITH YOU FOR 24 HOURS .  Name and phone number of your driver:   Special Instructions: N/A              Please read over the following fact sheets you were given: _____________________________________________________________________             [facility] - Preparing for Surgery Before surgery, you can play an important role.  Because skin is not  sterile, your skin needs to be as free of germs as possible.  You can reduce the number of germs on your skin by washing with CHG (chlorahexidine gluconate) soap before surgery.  CHG is an antiseptic cleaner which kills germs and bonds with the skin to continue killing germs even after washing. Please DO NOT use if you have an allergy to CHG or antibacterial soaps.  If your skin becomes reddened/irritated stop using the CHG and inform your nurse when you arrive at Short Stay. Do not shave (including legs and underarms) for at least 48 hours prior to the first CHG shower.  You may shave your face/neck. Please follow these instructions carefully:  1.  Shower with CHG Soap the night before surgery and the  morning of Surgery.  2.  If you  choose to wash your hair, wash your hair first as usual with your  normal  shampoo.  3.  After you shampoo, rinse your hair and body thoroughly to remove the  shampoo.                            4.  Use CHG as you would any other liquid soap.  You can apply chg directly  to the skin and wash                       Gently with a scrungie or clean washcloth.  5.  Apply the CHG Soap to your body ONLY FROM THE NECK DOWN.   Do not use on face/ open                           Wound or open sores. Avoid contact with eyes, ears mouth and genitals (private parts).                       Wash face,  Genitals (private parts) with your normal soap.             6.  Wash thoroughly, paying special attention to the area where your surgery  will be performed.  7.  Thoroughly rinse your body with warm water from the neck down.  8.  DO NOT shower/wash with your normal soap after using and rinsing off  the CHG Soap.                9.  Pat yourself dry with a clean towel.            10.  Wear clean pajamas.            11.  Place clean sheets on your bed the night of your first shower and do not  sleep with pets. Day of Surgery : Do not apply any lotions/deodorants the morning of surgery.   Please wear clean clothes to the hospital/surgery center.  How to Manage Your Diabetes Before and After Surgery  Why is it important to control my blood sugar before and after surgery? . Improving blood sugar levels before and after surgery helps healing and can limit problems. . A way of improving blood sugar control is eating a healthy diet by: o  Eating less sugar and carbohydrates o  Increasing activity/exercise o  Talking with your doctor about reaching your blood sugar goals . High blood sugars (greater than 180 mg/dL) can raise your risk of infections and slow your recovery, so you will need to focus on controlling your diabetes during the weeks before surgery. . Make sure that the doctor who takes care of your diabetes knows about your planned surgery including the date and location.  How do I manage my blood sugar before surgery? . Check your blood sugar at least 4 times a day, starting 2 days before surgery, to make sure that the level is not too high or low. o Check your blood sugar the morning of your surgery when you wake up and every 2 hours until you get to the Short Stay unit. . If your blood sugar is less than 70 mg/dL, you will need to treat for low blood sugar: o Do not take insulin. o Treat a low blood sugar (less than 70 mg/dL) with  cup of clear juice (  cranberry or apple), 4 glucose tablets, OR glucose gel. o Recheck blood sugar in 15 minutes after treatment (to make sure it is greater than 70 mg/dL). If your blood sugar is not greater than 70 mg/dL on recheck, call [phone] for further instructions. . Report your blood sugar to the short stay nurse when you get to Short Stay.  . If you are admitted to the hospital after surgery: o Your blood sugar will be checked by the staff and you will probably be given insulin after surgery (instead of oral diabetes medicines) to make sure you have good blood sugar levels. o The goal for blood sugar control after surgery is  80-180 mg/dL.   WHAT DO I DO ABOUT MY DIABETES MEDICATION?   Marland Kitchen Do not take oral diabetes medicines (pills) the morning of surgery.  . If your CBG is greater than 220 mg/dL, you may take  of your sliding scale  . (correction) dose of insulin.      Patient Signature:  Date:   Nurse Signature:  Date:   R

## 2019-04-24 ENCOUNTER — Other Ambulatory Visit (HOSPITAL_COMMUNITY)
Admission: RE | Admit: 2019-04-24 | Discharge: 2019-04-24 | Disposition: A | Discharge status: Home / Self Care | Payer: Medicare Other | Source: Ambulatory Visit | Attending: Urology | Admitting: Urology

## 2019-04-24 ENCOUNTER — Encounter (HOSPITAL_COMMUNITY): Payer: Self-pay

## 2019-04-24 ENCOUNTER — Other Ambulatory Visit: Payer: Self-pay

## 2019-04-24 ENCOUNTER — Encounter (HOSPITAL_COMMUNITY)
Admission: RE | Admit: 2019-04-24 | Discharge: 2019-04-24 | Disposition: A | Discharge status: Home / Self Care | Payer: Medicare Other | Source: Ambulatory Visit | Attending: Urology | Admitting: Urology

## 2019-04-24 ENCOUNTER — Telehealth: Payer: Self-pay | Admitting: *Deleted

## 2019-04-24 DIAGNOSIS — E1339 Other specified diabetes mellitus with other diabetic ophthalmic complication: Secondary | ICD-10-CM | POA: Diagnosis not present

## 2019-04-24 DIAGNOSIS — H42 Glaucoma in diseases classified elsewhere: Secondary | ICD-10-CM | POA: Diagnosis not present

## 2019-04-24 DIAGNOSIS — Z1159 Encounter for screening for other viral diseases: Secondary | ICD-10-CM | POA: Insufficient documentation

## 2019-04-24 DIAGNOSIS — R569 Unspecified convulsions: Secondary | ICD-10-CM | POA: Insufficient documentation

## 2019-04-24 DIAGNOSIS — M1990 Unspecified osteoarthritis, unspecified site: Secondary | ICD-10-CM | POA: Diagnosis not present

## 2019-04-24 DIAGNOSIS — E119 Type 2 diabetes mellitus without complications: Secondary | ICD-10-CM | POA: Insufficient documentation

## 2019-04-24 DIAGNOSIS — Z87891 Personal history of nicotine dependence: Secondary | ICD-10-CM | POA: Diagnosis not present

## 2019-04-24 DIAGNOSIS — H409 Unspecified glaucoma: Secondary | ICD-10-CM | POA: Insufficient documentation

## 2019-04-24 DIAGNOSIS — Z888 Allergy status to other drugs, medicaments and biological substances status: Secondary | ICD-10-CM | POA: Diagnosis not present

## 2019-04-24 DIAGNOSIS — Z7982 Long term (current) use of aspirin: Secondary | ICD-10-CM | POA: Insufficient documentation

## 2019-04-24 DIAGNOSIS — I10 Essential (primary) hypertension: Secondary | ICD-10-CM | POA: Insufficient documentation

## 2019-04-24 DIAGNOSIS — Z8673 Personal history of transient ischemic attack (TIA), and cerebral infarction without residual deficits: Secondary | ICD-10-CM | POA: Diagnosis not present

## 2019-04-24 DIAGNOSIS — Z01812 Encounter for preprocedural laboratory examination: Secondary | ICD-10-CM | POA: Insufficient documentation

## 2019-04-24 DIAGNOSIS — Z7984 Long term (current) use of oral hypoglycemic drugs: Secondary | ICD-10-CM | POA: Insufficient documentation

## 2019-04-24 DIAGNOSIS — C61 Malignant neoplasm of prostate: Secondary | ICD-10-CM | POA: Diagnosis not present

## 2019-04-24 DIAGNOSIS — Z881 Allergy status to other antibiotic agents status: Secondary | ICD-10-CM | POA: Diagnosis not present

## 2019-04-24 DIAGNOSIS — Z79899 Other long term (current) drug therapy: Secondary | ICD-10-CM | POA: Insufficient documentation

## 2019-04-24 LAB — CBC
HCT: 45.3 % (ref 39.0–52.0)
Hemoglobin: 14.3 g/dL (ref 13.0–17.0)
MCH: 29.1 pg (ref 26.0–34.0)
MCHC: 31.6 g/dL (ref 30.0–36.0)
MCV: 92.3 fL (ref 80.0–100.0)
Platelets: 227 10*3/uL (ref 150–400)
RBC: 4.91 MIL/uL (ref 4.22–5.81)
RDW: 12.9 % (ref 11.5–15.5)
WBC: 6 10*3/uL (ref 4.0–10.5)
nRBC: 0 % (ref 0.0–0.2)

## 2019-04-24 LAB — BASIC METABOLIC PANEL
Anion gap: 8 (ref 5–15)
BUN: 12 mg/dL (ref 8–23)
CO2: 28 mmol/L (ref 22–32)
Calcium: 9.3 mg/dL (ref 8.9–10.3)
Chloride: 102 mmol/L (ref 98–111)
Creatinine, Ser: 0.82 mg/dL (ref 0.61–1.24)
GFR calc Af Amer: >60 mL/min (ref >60)
GFR calc non Af Amer: >60 mL/min (ref >60)
Glucose, Bld: 100 mg/dL — ABNORMAL HIGH (ref 70–99)
Potassium: 3.8 mmol/L (ref 3.5–5.1)
Sodium: 138 mmol/L (ref 135–145)

## 2019-04-24 LAB — GLUCOSE, CAPILLARY: Glucose-Capillary: 99 mg/dL (ref 70–99)

## 2019-04-24 LAB — HEMOGLOBIN A1C
Hgb A1c MFr Bld: 6.4 % — ABNORMAL HIGH (ref 4.8–5.6)
Mean Plasma Glucose: 136.98 mg/dL

## 2019-04-24 LAB — SARS CORONAVIRUS 2 BY RT PCR (HOSPITAL ORDER, PERFORMED IN ~~LOC~~ HOSPITAL LAB): SARS Coronavirus 2: NEGATIVE

## 2019-04-24 NOTE — Progress Notes (Signed)
ASA was held for 2 weeks. Hx of seizure is remote pt requested that he continue taking Dilantin. No residual weakness from TIA or Stroke. EKG in Epic PCP is Dr.D.  Sharlett Iles

## 2019-04-24 NOTE — Telephone Encounter (Signed)
Called patient to remind of implant on 04-27-19, lvm for a return call

## 2019-04-24 NOTE — Progress Notes (Signed)
Anesthesia Chart Review   Case:  [id number] Date/Time:  04/27/19 0715   Procedures:      RADIOACTIVE SEED IMPLANT/BRACHYTHERAPY IMPLANT (N/A )     SPACE OAR INSTILLATION (N/A )   Anesthesia type:  Choice   Pre-op diagnosis:  PROSTATE CANCER   Location:  [place] / [facility]   Surgeon:  Franchot Gallo, MD      DISCUSSION: 78 yo former smoker (quit 09/10/91) with h/o DM II, TIA, remote h/o seizures, HLD, HTN, prostate cancer scheduled for above procedure 04/27/2019 with Dr. Franchot Gallo.   Pt can proceed with planned procedure barring acute status change.  VS: BP (!) 166/92 (BP Location: Left Arm)   Pulse 77   Temp 36.9 C (Oral)   Resp 18   Ht 6' (1.829 m)   Wt 89.4 kg   SpO2 97%   BMI 26.73 kg/m   PROVIDERS: [name], MD is PCP   Tyler Pita, MD is Radiation Oncologist  LABS: Labs reviewed: Acceptable for surgery. (all labs ordered are listed, but only abnormal results are displayed)  Labs Reviewed  BASIC METABOLIC PANEL - Abnormal; Notable for the following components:      Result Value   Glucose, Bld 100 (*)    All other components within normal limits  HEMOGLOBIN A1C - Abnormal; Notable for the following components:   Hgb A1c MFr Bld 6.4 (*)    All other components within normal limits  GLUCOSE, CAPILLARY  CBC  HEMOGLOBIN A1C     IMAGES: Chest Xray 01/22/2019 FINDINGS: The cardiomediastinal silhouette is within normal limits. Aortic atherosclerosis is noted. No airspace consolidation, edema, pleural effusion, pneumothorax is identified. There is mild lower thoracic dextroscoliosis.  IMPRESSION: No active cardiopulmonary disease.  EKG: 01/22/2019 Rate 76 bpm Normal sinus rhythm  Nonspecific T wave abnormality  CV:  Past Medical History:  Diagnosis Date  . Adenocarcinoma of prostate ([place]) 10/14  . Arthritis   . Diabetes mellitus without complication ([place])   . Glaucoma   . Hyperlipidemia   . Hypertension   . Seizures  ([place])   . Stroke ([place])   . TIA (transient ischemic attack)   . Tubular adenoma of colon 10/2010    Past Surgical History:  Procedure Laterality Date  . COLONOSCOPY    . DENTAL SURGERY     implants  . LEFT EYE SURGERY    . MENISCUS REPAIR Right   . POLYPECTOMY    . PROSTATE BIOPSY    . TONSILLECTOMY    . VASECTOMY      MEDICATIONS: . aspirin EC 325 MG tablet  . bimatoprost (LUMIGAN) 0.01 % SOLN  . Brinzolamide-Brimonidine 1-0.2 % SUSP  . cloNIDine (CATAPRES) 0.2 MG tablet  . gabapentin (NEURONTIN) 300 MG capsule  . hydrochlorothiazide (HYDRODIURIL) 25 MG tablet  . metFORMIN (GLUCOPHAGE-XR) 500 MG 24 hr tablet  . metoprolol succinate (TOPROL-XL) 50 MG 24 hr tablet  . phenytoin (DILANTIN) 100 MG ER capsule  . verapamil (CALAN-SR) 240 MG CR tablet   . sodium phosphate (FLEET) 7-19 GM/118ML enema 1 enema     [facility] Pre-Surgical Testing [phone] 04/24/19 12:00 PM

## 2019-04-26 NOTE — Anesthesia Preprocedure Evaluation (Addendum)
Anesthesia Evaluation  Patient identified by MRN, date of birth, ID band Patient awake    Reviewed: Allergy & Precautions, H&P , NPO status , Patient's Chart, lab work & pertinent test results  Airway Mallampati: I  TM Distance: >3 FB Neck ROM: Full    Dental no notable dental hx. (+) Edentulous Upper, Edentulous Lower, Upper Dentures, Implants,    Pulmonary neg pulmonary ROS, former smoker,    Pulmonary exam normal breath sounds clear to auscultation       Cardiovascular Exercise Tolerance: Good hypertension, Pt. on medications negative cardio ROS Normal cardiovascular exam Rhythm:Regular Rate:Normal  Chest Xray 01/22/2019 FINDINGS: The cardiomediastinal silhouette is within normal limits. Aortic atherosclerosis is noted. No airspace consolidation, edema, pleural effusion, pneumothorax is identified. There is mild lower thoracic dextroscoliosis. IMPRESSION: No active cardiopulmonary disease.  EKG: 01/22/2019 Rate 76 bpm Normal sinus rhythm  Nonspecific T wave abnormality   Neuro/Psych Seizures -,  TIACVA negative neurological ROS  negative psych ROS   GI/Hepatic negative GI ROS, Neg liver ROS,   Endo/Other  negative endocrine ROSdiabetes, Oral Hypoglycemic Agents  Renal/GU negative Renal ROS  negative genitourinary   Musculoskeletal negative musculoskeletal ROS (+) Arthritis , Osteoarthritis,    Abdominal   Peds negative pediatric ROS (+)  Hematology negative hematology ROS (+)   Anesthesia Other Findings   Reproductive/Obstetrics negative OB ROS                            Anesthesia Physical Anesthesia Plan  ASA: III  Anesthesia Plan: General   Post-op Pain Management:    Induction: Intravenous  PONV Risk Score and Plan: 2 and Ondansetron, Treatment may vary due to age or medical condition and Dexamethasone  Airway Management Planned: Oral ETT and LMA  Additional  Equipment:   Intra-op Plan:   Post-operative Plan: Extubation in OR  Informed Consent: I have reviewed the patients History and Physical, chart, labs and discussed the procedure including the risks, benefits and alternatives for the proposed anesthesia with the patient or authorized representative who has indicated his/her understanding and acceptance.       Plan Discussed with: CRNA, Anesthesiologist and Surgeon  Anesthesia Plan Comments: (  )        Anesthesia Quick Evaluation

## 2019-04-27 ENCOUNTER — Ambulatory Visit (HOSPITAL_COMMUNITY): Payer: Medicare Other

## 2019-04-27 ENCOUNTER — Ambulatory Visit (HOSPITAL_COMMUNITY)
Admission: RE | Admit: 2019-04-27 | Discharge: 2019-04-27 | Disposition: A | Discharge status: Home / Self Care | Payer: Medicare Other | Source: Other Acute Inpatient Hospital | Attending: Urology | Admitting: Urology

## 2019-04-27 ENCOUNTER — Encounter (HOSPITAL_COMMUNITY)
Admission: RE | Disposition: A | Discharge status: Home / Self Care | Payer: Self-pay | Source: Other Acute Inpatient Hospital | Attending: Urology

## 2019-04-27 ENCOUNTER — Encounter (HOSPITAL_COMMUNITY): Payer: Self-pay | Admitting: *Deleted

## 2019-04-27 ENCOUNTER — Ambulatory Visit (HOSPITAL_COMMUNITY): Payer: Medicare Other | Admitting: Physician Assistant

## 2019-04-27 ENCOUNTER — Encounter: Payer: Self-pay | Admitting: Medical Oncology

## 2019-04-27 ENCOUNTER — Ambulatory Visit (HOSPITAL_COMMUNITY): Payer: Medicare Other | Admitting: Anesthesiology

## 2019-04-27 DIAGNOSIS — Z888 Allergy status to other drugs, medicaments and biological substances status: Secondary | ICD-10-CM | POA: Insufficient documentation

## 2019-04-27 DIAGNOSIS — M1990 Unspecified osteoarthritis, unspecified site: Secondary | ICD-10-CM | POA: Insufficient documentation

## 2019-04-27 DIAGNOSIS — R569 Unspecified convulsions: Secondary | ICD-10-CM | POA: Insufficient documentation

## 2019-04-27 DIAGNOSIS — Z1159 Encounter for screening for other viral diseases: Secondary | ICD-10-CM | POA: Insufficient documentation

## 2019-04-27 DIAGNOSIS — E1339 Other specified diabetes mellitus with other diabetic ophthalmic complication: Secondary | ICD-10-CM | POA: Diagnosis not present

## 2019-04-27 DIAGNOSIS — C61 Malignant neoplasm of prostate: Secondary | ICD-10-CM | POA: Diagnosis not present

## 2019-04-27 DIAGNOSIS — Z87891 Personal history of nicotine dependence: Secondary | ICD-10-CM | POA: Insufficient documentation

## 2019-04-27 DIAGNOSIS — H42 Glaucoma in diseases classified elsewhere: Secondary | ICD-10-CM | POA: Insufficient documentation

## 2019-04-27 DIAGNOSIS — I10 Essential (primary) hypertension: Secondary | ICD-10-CM | POA: Insufficient documentation

## 2019-04-27 DIAGNOSIS — Z8673 Personal history of transient ischemic attack (TIA), and cerebral infarction without residual deficits: Secondary | ICD-10-CM | POA: Insufficient documentation

## 2019-04-27 DIAGNOSIS — Z881 Allergy status to other antibiotic agents status: Secondary | ICD-10-CM | POA: Insufficient documentation

## 2019-04-27 HISTORY — PX: RADIOACTIVE SEED IMPLANT: SHX5150

## 2019-04-27 HISTORY — PX: SPACE OAR INSTILLATION: SHX6769

## 2019-04-27 LAB — GLUCOSE, CAPILLARY: Glucose-Capillary: 104 mg/dL — ABNORMAL HIGH (ref 70–99)

## 2019-04-27 SURGERY — INSERTION, RADIATION SOURCE, PROSTATE
Anesthesia: General

## 2019-04-27 MED ORDER — MEPERIDINE HCL 50 MG/ML IJ SOLN: 6.2500 mg | INTRAMUSCULAR | Status: DC | PRN

## 2019-04-27 MED ORDER — OXYCODONE HCL 5 MG/5ML PO SOLN: 5.0000 mg | Freq: Once | ORAL | Status: DC | PRN

## 2019-04-27 MED ORDER — FENTANYL CITRATE (PF) 250 MCG/5ML IJ SOLN
INTRAMUSCULAR | Status: DC | PRN
  Administered 2019-04-27 (×7): 25 ug via INTRAVENOUS

## 2019-04-27 MED ORDER — PROPOFOL 10 MG/ML IV BOLUS
INTRAVENOUS | Status: AC
  Filled 2019-04-27: qty 20

## 2019-04-27 MED ORDER — STERILE WATER FOR IRRIGATION IR SOLN
Status: DC | PRN
  Administered 2019-04-27: 1000 mL

## 2019-04-27 MED ORDER — SODIUM CHLORIDE (PF) 0.9 % IJ SOLN
INTRAMUSCULAR | Status: AC
  Filled 2019-04-27: qty 10

## 2019-04-27 MED ORDER — LACTATED RINGERS IV SOLN
INTRAVENOUS | Status: DC
  Administered 2019-04-27 (×2): via INTRAVENOUS

## 2019-04-27 MED ORDER — LIDOCAINE 2% (20 MG/ML) 5 ML SYRINGE
INTRAMUSCULAR | Status: DC | PRN
  Administered 2019-04-27: 40 mg via INTRAVENOUS

## 2019-04-27 MED ORDER — PROPOFOL 10 MG/ML IV BOLUS
INTRAVENOUS | Status: DC | PRN
  Administered 2019-04-27: 170 mg via INTRAVENOUS

## 2019-04-27 MED ORDER — ONDANSETRON HCL 4 MG/2ML IJ SOLN
INTRAMUSCULAR | Status: DC | PRN
  Administered 2019-04-27: 4 mg via INTRAVENOUS

## 2019-04-27 MED ORDER — DEXAMETHASONE SODIUM PHOSPHATE 10 MG/ML IJ SOLN
INTRAMUSCULAR | Status: DC | PRN
  Administered 2019-04-27: 10 mg via INTRAVENOUS

## 2019-04-27 MED ORDER — ACETAMINOPHEN 325 MG PO TABS: 325.0000 mg | ORAL_TABLET | ORAL | Status: DC | PRN

## 2019-04-27 MED ORDER — FENTANYL CITRATE (PF) 100 MCG/2ML IJ SOLN: 25.0000 ug | INTRAMUSCULAR | Status: DC | PRN

## 2019-04-27 MED ORDER — IOHEXOL 300 MG/ML  SOLN
INTRAMUSCULAR | Status: DC | PRN
  Administered 2019-04-27: 08:00:00 5 mL

## 2019-04-27 MED ORDER — MIDAZOLAM HCL 2 MG/2ML IJ SOLN
INTRAMUSCULAR | Status: DC | PRN
  Administered 2019-04-27 (×2): 0.5 mg via INTRAVENOUS

## 2019-04-27 MED ORDER — SODIUM CHLORIDE 0.9 % IV SOLN
INTRAVENOUS | Status: DC | PRN
  Administered 2019-04-27: 50 ug/min via INTRAVENOUS

## 2019-04-27 MED ORDER — CEFAZOLIN SODIUM-DEXTROSE 2-4 GM/100ML-% IV SOLN
2.0000 g | Freq: Once | INTRAVENOUS | Status: AC
  Administered 2019-04-27: 2 g via INTRAVENOUS
  Filled 2019-04-27: qty 100

## 2019-04-27 MED ORDER — ONDANSETRON HCL 4 MG/2ML IJ SOLN: 4.0000 mg | Freq: Once | INTRAMUSCULAR | Status: DC | PRN

## 2019-04-27 MED ORDER — FENTANYL CITRATE (PF) 250 MCG/5ML IJ SOLN
INTRAMUSCULAR | Status: AC
  Filled 2019-04-27: qty 5

## 2019-04-27 MED ORDER — OXYCODONE HCL 5 MG PO TABS: 5.0000 mg | ORAL_TABLET | Freq: Once | ORAL | Status: DC | PRN

## 2019-04-27 MED ORDER — MIDAZOLAM HCL 2 MG/2ML IJ SOLN
INTRAMUSCULAR | Status: AC
  Filled 2019-04-27: qty 2

## 2019-04-27 MED ORDER — ACETAMINOPHEN 160 MG/5ML PO SOLN: 325.0000 mg | ORAL | Status: DC | PRN

## 2019-04-27 SURGICAL SUPPLY — 30 items
BAG URINE DRAINAGE (UROLOGICAL SUPPLIES) ×2 IMPLANT
BLADE CLIPPER SURG (BLADE) ×2 IMPLANT
CATH FOLEY 2WAY SLVR  5CC 16FR (CATHETERS) ×1
CATH FOLEY 2WAY SLVR 5CC 16FR (CATHETERS) ×1 IMPLANT
CATH ROBINSON RED A/P 20FR (CATHETERS) ×2 IMPLANT
CONT SPEC 4OZ CLIKSEAL STRL BL (MISCELLANEOUS) ×4 IMPLANT
COVER SURGICAL LIGHT HANDLE (MISCELLANEOUS) ×1 IMPLANT
COVER WAND RF STERILE (DRAPES) IMPLANT
DRAPE SURG IRRIG POUCH 19X23 (DRAPES) ×2 IMPLANT
DRSG TEGADERM 4X4.75 (GAUZE/BANDAGES/DRESSINGS) ×3 IMPLANT
DRSG TEGADERM 8X12 (GAUZE/BANDAGES/DRESSINGS) ×3 IMPLANT
GLOVE BIO SURGEON STRL SZ7.5 (GLOVE) ×2 IMPLANT
GLOVE BIOGEL M 8.0 STRL (GLOVE) ×2 IMPLANT
GLOVE ECLIPSE 8.0 STRL XLNG CF (GLOVE) ×3 IMPLANT
GLOVE SURG SS PI 8.0 STRL IVOR (GLOVE) IMPLANT
GOWN STRL REUS W/TWL LRG LVL3 (GOWN DISPOSABLE) ×2 IMPLANT
GOWN STRL REUS W/TWL XL LVL3 (GOWN DISPOSABLE) ×2 IMPLANT
HOLDER FOLEY CATH W/STRAP (MISCELLANEOUS) ×2 IMPLANT
I-Seed AGX100 ×1 IMPLANT
IMPL SPACEOAR SYSTEM 10ML (Spacer) ×1 IMPLANT
IMPLANT SPACEOAR SYSTEM 10ML (Spacer) ×2 IMPLANT
KIT TURNOVER KIT A (KITS) IMPLANT
MARKER SKIN DUAL TIP RULER LAB (MISCELLANEOUS) ×3 IMPLANT
PACK CYSTO (CUSTOM PROCEDURE TRAY) ×2 IMPLANT
STRIP CLOSURE SKIN 1/2X4 (GAUZE/BANDAGES/DRESSINGS) ×1 IMPLANT
SURGILUBE 2OZ TUBE FLIPTOP (MISCELLANEOUS) ×2 IMPLANT
SYR 10ML LL (SYRINGE) ×2 IMPLANT
TOWEL OR 17X26 10 PK STRL BLUE (TOWEL DISPOSABLE) ×2 IMPLANT
UNDERPAD 30X30 (UNDERPADS AND DIAPERS) ×4 IMPLANT
WATER STERILE IRR 1000ML UROMA (IV SOLUTION) ×2 IMPLANT

## 2019-04-27 NOTE — H&P (Signed)
H&P  Chief Complaint: Prostate cancer   History of Present Illness: 78 year old male presents for I 78 brachytherapy for mgmt on low/intermediate risk PCa. He has started ST ADT.  Past Medical History:  Diagnosis Date  . Adenocarcinoma of prostate ([place]) 10/14  . Arthritis   . Diabetes mellitus without complication ([employer])   . Glaucoma   . Hyperlipidemia   . Hypertension   . Seizures ([place])   . Stroke ([place])   . TIA (transient ischemic attack)   . Tubular adenoma of colon 10/2010    Past Surgical History:  Procedure Laterality Date  . COLONOSCOPY    . DENTAL SURGERY     implants  . LEFT EYE SURGERY    . MENISCUS REPAIR Right   . POLYPECTOMY    . PROSTATE BIOPSY    . TONSILLECTOMY    . VASECTOMY      Home Medications:    Allergies:  Allergies  Allergen Reactions  . Clindamycin/Lincomycin Anaphylaxis  . Grapefruit Concentrate Swelling    Lip swelling / Groin swelling   . Ace Inhibitors     REACTION: angioedema  . Ciprofloxacin Swelling    Family History  Problem Relation Age of Onset  . Heart failure Mother   . Diabetes Mother   . Heart attack Father   . Prostate cancer Brother   . Stroke Granddaughter   . Colon cancer Neg Hx     Social History:  reports that he quit smoking about 27 years ago. His smoking use included cigars. He smoked 0.00 packs per day for 0.50 years. He has never used smokeless tobacco. He reports that he does not drink alcohol or use drugs.  ROS: A complete review of systems was performed.  All systems are negative except for pertinent findings as noted.  Physical Exam:  Vital signs in last 24 hours: Temp:  [98.5 F (36.9 C)] 98.5 F (36.9 C) (05/18 0536) Pulse Rate:  [78] 78 (05/18 0536) Resp:  [17] 17 (05/18 0536) BP: (143)/(89) 143/89 (05/18 0536) SpO2:  [99 %] 99 % (05/18 0536) Constitutional:  Alert and oriented, No acute distress Cardiovascular: Regular rate  Respiratory: Normal respiratory effort GI: Abdomen is soft,  nontender, nondistended, no abdominal masses. No CVAT.  Genitourinary: Normal male phallus, testes are descended bilaterally and non-tender and without masses, scrotum is normal in appearance without lesions or masses, perineum is normal on inspection. Lymphatic: No lymphadenopathy Neurologic: Grossly intact, no focal deficits Psychiatric: Normal mood and affect  Laboratory Data:  Recent Labs    04/24/19 1010  WBC 6.0  HGB 14.3  HCT 45.3  PLT 227    Recent Labs    04/24/19 1010  NA 138  K 3.8  CL 102  GLUCOSE 100*  BUN 12  CALCIUM 9.3  CREATININE 0.82     No results found for this or any previous visit (from the past 24 hour(s)). Recent Results (from the past 240 hour(s))  SARS Coronavirus 2 (CEPHEID - Performed in [place] hospital lab), Hosp Order     Status: None   Collection Time: 04/24/19 10:31 AM  Result Value Ref Range Status   SARS Coronavirus 2 NEGATIVE NEGATIVE Final    Comment: (NOTE) If result is NEGATIVE SARS-CoV-2 target nucleic acids are NOT DETECTED. The SARS-CoV-2 RNA is generally detectable in upper and lower  respiratory specimens during the acute phase of infection. The lowest  concentration of SARS-CoV-2 viral copies this assay can detect is 250  copies /  mL. A negative result does not preclude SARS-CoV-2 infection  and should not be used as the sole basis for treatment or other  patient management decisions.  A negative result may occur with  improper specimen collection / handling, submission of specimen other  than nasopharyngeal swab, presence of viral mutation(s) within the  areas targeted by this assay, and inadequate number of viral copies  (<250 copies / mL). A negative result must be combined with clinical  observations, patient history, and epidemiological information. If result is POSITIVE SARS-CoV-2 target nucleic acids are DETECTED. The SARS-CoV-2 RNA is generally detectable in upper and lower  respiratory specimens dur ing the  acute phase of infection.  Positive  results are indicative of active infection with SARS-CoV-2.  Clinical  correlation with patient history and other diagnostic information is  necessary to determine patient infection status.  Positive results do  not rule out bacterial infection or co-infection with other viruses. If result is PRESUMPTIVE POSTIVE SARS-CoV-2 nucleic acids MAY BE PRESENT.   A presumptive positive result was obtained on the submitted specimen  and confirmed on repeat testing.  While 2019 novel coronavirus  (SARS-CoV-2) nucleic acids may be present in the submitted sample  additional confirmatory testing may be necessary for epidemiological  and / or clinical management purposes  to differentiate between  SARS-CoV-2 and other Sarbecovirus currently known to infect humans.  If clinically indicated additional testing with an alternate test  methodology [phone]) is advised. The SARS-CoV-2 RNA is generally  detectable in upper and lower respiratory sp ecimens during the acute  phase of infection. The expected result is Negative. Fact Sheet for Patients:  StrictlyIdeas.no Fact Sheet for Healthcare Providers: BankingDealers.co.za This test is not yet approved or cleared by the [place] FDA and has been authorized for detection and/or diagnosis of SARS-CoV-2 by FDA under an Emergency Use Authorization (EUA).  This EUA will remain in effect (meaning this test can be used) for the duration of the COVID-19 declaration under Section 564(b)(1) of the Act, 21 U.S.C. section 360bbb-3(b)(1), unless the authorization is terminated or revoked sooner. Performed at [facility], [place] [address] [place], [place] [address]     Renal Function: Recent Labs    04/24/19 1010  CREATININE 0.82   Estimated Creatinine Clearance: 84.1 mL/min (by C-G formula based on SCr of 0.82 mg/dL).  Radiologic Imaging: No results  found.  Impression/Assessment:  Prostate cancer  Plan:  I-125 brachytherapy in conjunction w/ Dr Tammi Klippel

## 2019-04-27 NOTE — Op Note (Signed)
Preoperative diagnosis: Clinical stage TI C adenocarcinoma the prostate   Postoperative diagnosis: Same   Procedure: I-125 prostate seed implantation, flexible cystoscopy, placement of SpaceOAR  Surgeon: Lillette Boxer. Savi Lastinger M.D.  Radiation Oncologist: Tyler Pita, M.D.  Anesthesia: Gen.   Indications: Patient  was diagnosed with clinical stage TIc prostate cancer. We had extensive discussion with him about treatment options versus. He elected to proceed with seed implantation. He underwent consultation my office as well as with Dr. Tammi Klippel. He appeared to understand the advantages disadvantages potential risks of this treatment option. Full informed consent has been obtained.   Technique and findings: Patient was brought the operating room where he had successful induction of general anesthesia. He was placed in dorso-lithotomy position and prepped and draped in usual manner. Appropriate surgical timeout was performed. Radiation oncology department placed a transrectal ultrasound probe anchoring stand. Foley catheter with contrast in the balloon was inserted without difficulty. Anchoring needles were placed within the prostate. Rectal tube was placed. Real-time contouring of the urethra prostate and rectum were performed and the dosing parameters were established. Targeted dose was 145 gray.  I was then called  to the operating suite suite for placement of the needles. A second timeout was performed. All needle passage was done with real-time transrectal ultrasound guidance with the sagittal plane. A total of 26 needles were placed.  70 active seeds were implanted.  I then proceeded with placement of SpaceOAR by introducing a needle with the bevel angled inferiorly approximately 2 cm superior to the anus. This was angled downward and under direct ultrasound was placed within the space between the prostatic capsule and rectum. This was confirmed with a small amount of sterile saline injected and  this was performed under direct ultrasound. I then attached the SpaceOAR to the needle and injected this in the space between the prostate and rectum with good placement noted. The Foley catheter was removed and flexible cystoscopy failed to show any seeds outside the prostate.  The patient was brought to recovery room in stable condition, having tolerated the procedure well.Marland Kitchen

## 2019-04-27 NOTE — Anesthesia Postprocedure Evaluation (Signed)
Anesthesia Post Note  Patient: Anthony Gladish Sr.  Procedure(s) Performed: RADIOACTIVE SEED IMPLANT/BRACHYTHERAPY IMPLANT (N/A ) SPACE OAR INSTILLATION (N/A )     Patient location during evaluation: PACU Anesthesia Type: General Level of consciousness: awake and alert Pain management: pain level controlled Vital Signs Assessment: post-procedure vital signs reviewed and stable Respiratory status: spontaneous breathing, nonlabored ventilation, respiratory function stable and patient connected to nasal cannula oxygen Cardiovascular status: blood pressure returned to baseline and stable Postop Assessment: no apparent nausea or vomiting Anesthetic complications: no    Last Vitals:  Vitals:   04/27/19 0930 04/27/19 0945  BP: 136/87   Pulse: 77 67  Resp: 14 14  Temp:    SpO2: 98% 98%    Last Pain:  Vitals:   04/27/19 0945  TempSrc:   PainSc: 0-No pain                 Latausha Flamm

## 2019-04-27 NOTE — Anesthesia Procedure Notes (Signed)
Date/Time: 04/27/2019 9:08 AM Performed by: Cynda Familia, CRNA Oxygen Delivery Method: Simple face mask Placement Confirmation: positive ETCO2 and breath sounds checked- equal and bilateral

## 2019-04-27 NOTE — Transfer of Care (Signed)
Immediate Anesthesia Transfer of Care Note  Patient: Anthony Hartshorn Sr.  Procedure(s) Performed: RADIOACTIVE SEED IMPLANT/BRACHYTHERAPY IMPLANT (N/A ) SPACE OAR INSTILLATION (N/A )  Patient Location: PACU  Anesthesia Type:General  Level of Consciousness: awake and alert   Airway & Oxygen Therapy: Patient Spontanous Breathing and Patient connected to face mask oxygen  Post-op Assessment: Report given to RN and Post -op Vital signs reviewed and stable  Post vital signs: Reviewed and stable  Last Vitals:  Vitals Value Taken Time  BP    Temp    Pulse 73 04/27/2019  9:20 AM  Resp 11 04/27/2019  9:20 AM  SpO2 100 % 04/27/2019  9:20 AM  Vitals shown include unvalidated device data.  Last Pain:  Vitals:   04/27/19 0536  TempSrc: Oral  PainSc: 0-No pain      Patients Stated Pain Goal: 2 (84/73/08 [id number])  Complications: No apparent anesthesia complications

## 2019-04-27 NOTE — Discharge Instructions (Signed)
Radioactive Seed Implant Home Care Instructions   Activity:    Rest for the remainder of the day.  Do not drive or operate equipment today.  You may resume normal  activities in a few days as instructed by your physician, without risk of harmful radiation exposure to those around you, provided you follow the time and distance precautions on the Radiation Oncology Instruction Sheet.   Meals: Drink plenty of lipuids and eat light foods, such as gelatin or soup this evening .  You may return to normal meal plan tomorrow.  Return To Work: You may return to work as instructed by [employer].  Special Instruction:   If any seeds are found, use tweezers to pick up seeds and place in a glass container of any kind and bring to your physician's office.  Call your physician if any of these symptoms occur:   Persistent or heavy bleeding  Urine stream diminishes or stops completely after catheter is removed  Fever equal to or greater than 101 degrees F  Cloudy urine with a strong foul odor  Severe pain  You may feel some burning pain and/or hesitancy when you urinate after the catheter is removed.  These symptoms may increase over the next few weeks, but should diminish within forur to six weeks.  Applying moist heat to the lower abdomen or a hot tub bath may help relieve the pain.  If the discomfort becomes severe, please call your physician for additional medications.   Good news--PSA down to 2.77!   General Anesthesia, Adult, Care After This sheet gives you information about how to care for yourself after your procedure. Your health care provider may also give you more specific instructions. If you have problems or questions, contact your health care provider. What can I expect after the procedure? After the procedure, the following side effects are common:  Pain or discomfort at the IV site.  Nausea.  Vomiting.  Sore throat.  Trouble concentrating.  Feeling cold or  chills.  Weak or tired.  Sleepiness and fatigue.  Soreness and body aches. These side effects can affect parts of the body that were not involved in surgery. Follow these instructions at home:  For at least 24 hours after the procedure:  Have a responsible adult stay with you. It is important to have someone help care for you until you are awake and alert.  Rest as needed.  Do not: ? Participate in activities in which you could fall or become injured. ? Drive. ? Use heavy machinery. ? Drink alcohol. ? Take sleeping pills or medicines that cause drowsiness. ? Make important decisions or sign legal documents. ? Take care of children on your own. Eating and drinking  Follow any instructions from your health care provider about eating or drinking restrictions.  When you feel hungry, start by eating small amounts of foods that are soft and easy to digest (bland), such as toast. Gradually return to your regular diet.  Drink enough fluid to keep your urine pale yellow.  If you vomit, rehydrate by drinking water, juice, or clear broth. General instructions  If you have sleep apnea, surgery and certain medicines can increase your risk for breathing problems. Follow instructions from your health care provider about wearing your sleep device: ? Anytime you are sleeping, including during daytime naps. ? While taking prescription pain medicines, sleeping medicines, or medicines that make you drowsy.  Return to your normal activities as told by your health care provider. Ask your  health care provider what activities are safe for you.  Take over-the-counter and prescription medicines only as told by your health care provider.  If you smoke, do not smoke without supervision.  Keep all follow-up visits as told by your health care provider. This is important. Contact a health care provider if:  You have nausea or vomiting that does not get better with medicine.  You cannot eat or drink  without vomiting.  You have pain that does not get better with medicine.  You are unable to pass urine.  You develop a skin rash.  You have a fever.  You have redness around your IV site that gets worse. Get help right away if:  You have difficulty breathing.  You have chest pain.  You have blood in your urine or stool, or you vomit blood. Summary  After the procedure, it is common to have a sore throat or nausea. It is also common to feel tired.  Have a responsible adult stay with you for the first 24 hours after general anesthesia. It is important to have someone help care for you until you are awake and alert.  When you feel hungry, start by eating small amounts of foods that are soft and easy to digest (bland), such as toast. Gradually return to your regular diet.  Drink enough fluid to keep your urine pale yellow.  Return to your normal activities as told by your health care provider. Ask your health care provider what activities are safe for you. This information is not intended to replace advice given to you by your health care provider. Make sure you discuss any questions you have with your health care provider. Document Released: 03/04/2001 Document Revised: 07/12/2017 Document Reviewed: 07/12/2017 Elsevier Interactive Patient Education  2019 [employer].

## 2019-04-27 NOTE — Anesthesia Procedure Notes (Signed)
Procedure Name: LMA Insertion Date/Time: 04/27/2019 7:43 AM Performed by: Cynda Familia, CRNA Pre-anesthesia Checklist: Patient identified, Emergency Drugs available, Suction available and Patient being monitored Patient Re-evaluated:Patient Re-evaluated prior to induction Oxygen Delivery Method: Circle System Utilized Preoxygenation: Pre-oxygenation with 100% oxygen Induction Type: IV induction Ventilation: Mask ventilation without difficulty LMA: LMA inserted and LMA with gastric port inserted LMA Size: 5.0 Tube type: Oral (20 cc air) Number of attempts: 1 Airway Equipment and Method: Bite block Placement Confirmation: positive ETCO2 Tube secured with: Tape Dental Injury: Teeth and Oropharynx as per pre-operative assessment  Comments: Oddono-- smooth IV induction-- LMA AM CRNA atraumatic-- mouth as preop bilat BS Oddono

## 2019-04-28 ENCOUNTER — Encounter (HOSPITAL_COMMUNITY): Payer: Self-pay | Admitting: Urology

## 2019-04-28 ENCOUNTER — Telehealth: Payer: Self-pay | Admitting: *Deleted

## 2019-04-28 NOTE — Telephone Encounter (Signed)
CALLED PATIENT TO INFORM OF MRI FOR 05-13-19, ARRIVAL TIME- 12:30 PM @ [facility], NO RESTRICTIONS TO TEST, SPOKE WITH PATIENT AND HE IS AWARE OF THIS TEST

## 2019-05-01 NOTE — Progress Notes (Signed)
  Radiation Oncology         [phone]) [phone] ________________________________  Name: Hubbard Hartshorn Sr. MRN: [id number]  Date: 05/01/2019  DOB: 12-Oct-1941       Prostate Seed Implant  XM:IWOEHOZY, Quillian Quince, MD  No ref. provider found  DIAGNOSIS: 78 y.o. gentleman with Stage T1c adenocarcinoma of the prostate with Gleason score of 4+3, and PSA of 8.37.  No diagnosis found.  PROCEDURE: Insertion of radioactive I-125 seeds into the prostate gland.  RADIATION DOSE: 145 Gy, definitive therapy.  TECHNIQUE: Dakoda Laventure Sr. was brought to the operating room with the urologist. He was placed in the dorsolithotomy position. He was catheterized and a rectal tube was inserted. The perineum was shaved, prepped and draped. The ultrasound probe was then introduced into the rectum to see the prostate gland.  TREATMENT DEVICE: A needle grid was attached to the ultrasound probe stand and anchor needles were placed.  3D PLANNING: The prostate was imaged in 3D using a sagittal sweep of the prostate probe. These images were transferred to the planning computer. There, the prostate, urethra and rectum were defined on each axial reconstructed image. Then, the software created an optimized 3D plan and a few seed positions were adjusted. The quality of the plan was reviewed using [facility] information for the target and the following two organs at risk:  Urethra and Rectum.  Then the accepted plan was printed and handed off to the radiation therapist.  Under my supervision, the custom loading of the seeds and spacers was carried out and loaded into sealed vicryl sleeves.  These pre-loaded needles were then placed into the needle holder.Marland Kitchen  PROSTATE VOLUME STUDY:  Using transrectal ultrasound the volume of the prostate was verified to be 41.8 cc.  SPECIAL TREATMENT PROCEDURE/SUPERVISION AND HANDLING: The pre-loaded needles were then delivered under sagittal guidance. A total of 26 needles were used to deposit 70 seeds in the  prostate gland. The individual seed activity was 0.467 mCi.  SpaceOAR:  Yes  COMPLEX SIMULATION: At the end of the procedure, an anterior radiograph of the pelvis was obtained to document seed positioning and count. Cystoscopy was performed to check the urethra and bladder.  MICRODOSIMETRY: At the end of the procedure, the patient was emitting 0.11 mR/hr at 1 meter. Accordingly, he was considered safe for hospital discharge.  PLAN: The patient will return to the radiation oncology clinic for post implant CT dosimetry in three weeks.   ________________________________  Sheral Apley Tammi Klippel, M.D.

## 2019-05-07 ENCOUNTER — Other Ambulatory Visit: Payer: Self-pay | Admitting: Urology

## 2019-05-07 ENCOUNTER — Ambulatory Visit: Payer: Self-pay | Admitting: Urology

## 2019-05-07 ENCOUNTER — Telehealth: Payer: Self-pay | Admitting: Urology

## 2019-05-07 DIAGNOSIS — C61 Malignant neoplasm of prostate: Secondary | ICD-10-CM

## 2019-05-07 MED ORDER — SILODOSIN 4 MG PO CAPS: 4.0000 mg | ORAL_CAPSULE | Freq: Every day | ORAL | 5 refills | Status: DC

## 2019-05-07 NOTE — Telephone Encounter (Signed)
I called and spoke with the patient returning his phone call regarding concerning lower urinary tract symptoms following his recent radioactive seed implant.  He is currently approximately 10 days out from his seed implant procedure and reports that since the time of the procedure, he has had persistent difficulty emptying his bladder with significant pressure and urge to void but only voiding small amounts with each trip to the bathroom, straining to void and mild dysuria at the beginning of his stream.  He has significant increased frequency and urgency due to inability to completely empty his bladder on voiding.  Intermittently, he will have a better force of stream but for the most part it remains quite weak.  He denies any gross hematuria, fever, chills or night sweats.  He reports that his LUTS are not progressively worsening but they do not seem to be improving either.  He is not currently taking any medication for prostate enlargement or BOO.  I have offered him a trial of an alpha-blocker to see if this will help alleviate his symptoms and he is interested.  A prescription for Rapaflo has been sent to his pharmacy and he will start taking this in the evenings beginning tonight.  He will call to report his progress early next week but was encouraged to call Dr. Alan Ripper office sooner if his symptoms progressively worsen or if this is of no benefit at all.  He has a scheduled follow-up with Jiles Crocker, NP on 05/15/2019 at 10 AM and then a follow-up visit here in radiation oncology at 1 PM that same afternoon.  I am hopeful that he will see significant improvement prior to that time but he knows to call with any questions or concerns in the interim.  I will share this discussion with Jiles Crocker, NP and Dr. Diona Fanti just to keep everyone in the loop.  His next visit with Dr. Diona Fanti is currently scheduled for 07/22/2019.  Anthony Walters, MMS, PA-C [place] [facility] [place]: [phone]  Fax: [phone]

## 2019-05-13 ENCOUNTER — Other Ambulatory Visit: Payer: Self-pay

## 2019-05-13 ENCOUNTER — Ambulatory Visit (HOSPITAL_COMMUNITY)
Admission: RE | Admit: 2019-05-13 | Discharge: 2019-05-13 | Disposition: A | Discharge status: Home / Self Care | Payer: Medicare Other | Source: Ambulatory Visit | Attending: Urology | Admitting: Urology

## 2019-05-13 ENCOUNTER — Ambulatory Visit: Payer: Medicare Other | Admitting: Radiation Oncology

## 2019-05-13 ENCOUNTER — Ambulatory Visit: Payer: Medicare Other | Admitting: Urology

## 2019-05-13 DIAGNOSIS — C61 Malignant neoplasm of prostate: Secondary | ICD-10-CM | POA: Insufficient documentation

## 2019-05-14 ENCOUNTER — Telehealth: Payer: Self-pay | Admitting: *Deleted

## 2019-05-14 NOTE — Telephone Encounter (Signed)
CALLED PATIENT TO REMIND OF POST SEED APPTS., SPOKE WITH PATIENT AND HE IS AWARE OF THESE APPTS.

## 2019-05-15 ENCOUNTER — Ambulatory Visit
Admission: RE | Admit: 2019-05-15 | Discharge: 2019-05-15 | Disposition: A | Discharge status: Home / Self Care | Payer: Medicare Other | Source: Ambulatory Visit | Attending: Urology | Admitting: Urology

## 2019-05-15 ENCOUNTER — Other Ambulatory Visit: Payer: Self-pay

## 2019-05-15 ENCOUNTER — Encounter: Payer: Self-pay | Admitting: Urology

## 2019-05-15 ENCOUNTER — Ambulatory Visit
Admit: 2019-05-15 | Discharge: 2019-05-15 | Disposition: A | Discharge status: Home / Self Care | Payer: Medicare Other | Source: Ambulatory Visit | Attending: Urology | Admitting: Urology

## 2019-05-15 VITALS — BP 142/92 | HR 63 | Temp 98.6°F | Resp 20 | Wt 196.2 lb

## 2019-05-15 VITALS — BP 142/92 | HR 63 | Temp 98.6°F | Resp 20 | Ht 72.0 in | Wt 196.4 lb

## 2019-05-15 DIAGNOSIS — R531 Weakness: Secondary | ICD-10-CM | POA: Diagnosis not present

## 2019-05-15 DIAGNOSIS — R3912 Poor urinary stream: Secondary | ICD-10-CM | POA: Insufficient documentation

## 2019-05-15 DIAGNOSIS — R3911 Hesitancy of micturition: Secondary | ICD-10-CM | POA: Diagnosis not present

## 2019-05-15 DIAGNOSIS — R350 Frequency of micturition: Secondary | ICD-10-CM | POA: Insufficient documentation

## 2019-05-15 DIAGNOSIS — C61 Malignant neoplasm of prostate: Secondary | ICD-10-CM | POA: Diagnosis present

## 2019-05-15 DIAGNOSIS — Z7984 Long term (current) use of oral hypoglycemic drugs: Secondary | ICD-10-CM | POA: Insufficient documentation

## 2019-05-15 DIAGNOSIS — Z7982 Long term (current) use of aspirin: Secondary | ICD-10-CM | POA: Insufficient documentation

## 2019-05-15 DIAGNOSIS — R351 Nocturia: Secondary | ICD-10-CM | POA: Diagnosis not present

## 2019-05-15 DIAGNOSIS — Z79899 Other long term (current) drug therapy: Secondary | ICD-10-CM | POA: Diagnosis not present

## 2019-05-15 DIAGNOSIS — Z510 Encounter for antineoplastic radiation therapy: Secondary | ICD-10-CM | POA: Insufficient documentation

## 2019-05-15 DIAGNOSIS — Z923 Personal history of irradiation: Secondary | ICD-10-CM | POA: Insufficient documentation

## 2019-05-15 NOTE — Addendum Note (Signed)
Encounter addended by: Freeman Caldron, PA-C on: 05/15/2019 6:03 PM  Actions taken: Clinical Note Signed

## 2019-05-15 NOTE — Progress Notes (Signed)
  Radiation Oncology         [phone]) [phone] ________________________________  Name: Anthony Hartshorn Sr. MRN: [id number]  Date: 05/15/2019  DOB: 1941-09-02  COMPLEX SIMULATION NOTE  NARRATIVE:  The patient was brought to the [facility] today following prostate seed implantation approximately one month ago.  Identity was confirmed.  All relevant records and images related to the planned course of therapy were reviewed.  Then, the patient was set-up supine.  CT images were obtained.  The CT images were loaded into the planning software.  Then the prostate and rectum were contoured.  Treatment planning then occurred.  The implanted iodine 125 seeds were identified by the physics staff for projection of radiation distribution  I have requested : 3D Simulation  I have requested a DVH of the following structures: Prostate and rectum.    ________________________________  Sheral Apley Tammi Klippel, M.D.  This document serves as a record of services personally performed by Tyler Pita, MD. It was created on his behalf by Wilburn Mylar, a trained medical scribe. The creation of this record is based on the scribe's personal observations and the provider's statements to them. This document has been checked and approved by the attending provider.

## 2019-05-15 NOTE — Progress Notes (Addendum)
Radiation Oncology         [phone]) [phone] ________________________________  Name: Anthony Hartshorn Sr. MRN: [id number]  Date: 05/15/2019  DOB: 1941/07/13  Post-Seed Follow-Up Visit Note   CC: [name], MD  [name], MD  Diagnosis:   78 y.o. gentleman with Stage T1c adenocarcinoma of the prostate with Gleason score of 4+3, and PSA of 8.37.    ICD-10-CM   1. Malignant neoplasm of prostate (HCC) C61     Interval Since Last Radiation:  3 weeks 04/27/19:  Insertion of radioactive I-125 seeds into the prostate gland; 145 Gy, definitive therapy with placement of SpaceOAR gel.  Narrative:  The patient returns today for routine follow-up.  He is complaining of increased urinary frequency, urgency, weak stream and urinary hesitation symptoms. He occasionally strains to void and does not feel like he empties his bladder completely on voiding.  He specifically denies dysuria or gross hematuria.  He filled out a questionnaire regarding urinary function today providing and overall IPSS score of 21 characterizing his symptoms as moderate-severe.  His pre-implant score was 11. He denies any bowel symptoms.  He was started on daily Rapaflo 1 week ago to help manage his LUTS.  He has noted some mild improvement but continues with nocturia 2-3 times per night and an overall weak flow of stream.  He was evaluated at [facility] urology with Jiles Crocker, NP this morning and advised to increase the dose of Rapaflo to twice daily.  ALLERGIES:  is allergic to clindamycin/lincomycin; grapefruit concentrate; ace inhibitors; and ciprofloxacin.  Meds: Current Outpatient Medications  Medication Sig Dispense Refill  . aspirin EC 325 MG tablet Take 325 mg by mouth daily.    . bimatoprost (LUMIGAN) 0.01 % SOLN Place 1 drop into both eyes at bedtime.     . Brinzolamide-Brimonidine 1-0.2 % SUSP Place 1 drop into both eyes 2 (two) times daily. [place]    . cloNIDine (CATAPRES) 0.2 MG tablet Take 0.2 mg by mouth  2 (two) times daily.     Marland Kitchen gabapentin (NEURONTIN) 300 MG capsule Take 300 mg by mouth 2 (two) times daily as needed (neuropathy).     . hydrochlorothiazide (HYDRODIURIL) 25 MG tablet Take 25 mg by mouth daily.    . metFORMIN (GLUCOPHAGE-XR) 500 MG 24 hr tablet Take 500 mg by mouth daily with supper.    . metoprolol succinate (TOPROL-XL) 50 MG 24 hr tablet Take 50 mg by mouth at bedtime. Take with or immediately following a meal.     . phenytoin (DILANTIN) 100 MG ER capsule Take 100 mg by mouth 2 (two) times daily.     . silodosin (RAPAFLO) 4 MG CAPS capsule Take 1 capsule (4 mg total) by mouth daily after supper. 30 capsule 5  . verapamil (CALAN-SR) 240 MG CR tablet Take 240 mg by mouth daily.      No current facility-administered medications for this visit.     Physical Findings: In general this is a well appearing African-American male in no acute distress. He's alert and oriented x4 and appropriate throughout the examination. Cardiopulmonary assessment is negative for acute distress and he exhibits normal effort.   Lab Findings: Lab Results  Component Value Date   WBC 6.0 04/24/2019   HGB 14.3 04/24/2019   HCT 45.3 04/24/2019   MCV 92.3 04/24/2019   PLT 227 04/24/2019    Radiographic Findings:  Patient underwent CT imaging in our clinic for post implant dosimetry. The CT will be reviewed by Dr. Tammi Klippel to  confirm an adequate distribution of radioactive seeds throughout the prostate gland and ensure that there are no seeds in or near the rectum.  He had an MRI prostate on Wednesday, 05/13/2019 and those images will be fused with his CT images for further evaluation.  We suspect the final radiation plan and dosimetry will show appropriate coverage of the prostate gland.  He understands that we will call him should there be any unanticipated findings on further review of his imaging.  Impression/Plan: 78 y.o. gentleman with Stage T1c adenocarcinoma of the prostate with Gleason score of 4+3,  and PSA of 8.37. The patient is recovering from the effects of radiation. His urinary symptoms should gradually improve over the next 4-6 months. We talked about this today. He is encouraged by his improvement already and is otherwise pleased with his outcome. We also talked about long-term follow-up for prostate cancer following seed implant. He understands that ongoing PSA determinations and digital rectal exams will help perform surveillance to rule out disease recurrence. He met with Jiles Crocker, NP at [facility] this morning and  has a follow up appointment scheduled with [place] in 07/2019. He understands what to expect with his PSA measures. Patient was also educated today about some of the long-term effects from radiation including a small risk for rectal bleeding and possibly erectile dysfunction. We talked about some of the general management approaches to these potential complications. However, I did encourage the patient to contact our office or return at any point if he has questions or concerns related to his previous radiation and prostate cancer.    Anthony Johns, PA-C

## 2019-05-15 NOTE — Progress Notes (Signed)
Anthony Walters is here for a post-seed appointment today. He had his MRI on June 3 ,2020. Patient states that he seen his urologist this morning. Patient denies  Any dysuria or hematuria.Patient states that he does not empty his bladder with urination. States that his stream is weak. Patient reports some urgency. Patient denies any leakage. Patient reports nocturia x3. Vitals:   05/15/19 1347  BP: (!) 142/92  Pulse: 63  Resp: 20  Temp: 98.6 F (37 C)  TempSrc: Oral  SpO2: 100%  Weight: 196 lb 4 oz (89 kg)

## 2019-05-17 NOTE — Addendum Note (Signed)
Encounter addended by: Tyler Pita, MD on: 05/17/2019 5:27 PM  Actions taken: Medication List reviewed, Problem List reviewed, Allergies reviewed

## 2019-05-28 ENCOUNTER — Telehealth: Payer: Self-pay | Admitting: *Deleted

## 2019-05-28 NOTE — Telephone Encounter (Signed)
Called patient to inform that Ashlyn would like him to see his urologist or PCP, for itchy rash, spoke with patient and he verified understanding this.

## 2019-06-02 ENCOUNTER — Encounter: Payer: Self-pay | Admitting: Radiation Oncology

## 2019-06-02 DIAGNOSIS — C61 Malignant neoplasm of prostate: Secondary | ICD-10-CM | POA: Diagnosis not present

## 2019-06-28 NOTE — Progress Notes (Signed)
  Radiation Oncology         [phone]) [phone] ________________________________  Name: Hubbard Hartshorn Sr. MRN: [id number]  Date: 06/02/2019  DOB: 1941/04/12  3D Planning Note   Prostate Brachytherapy Post-Implant Dosimetry  Diagnosis: 78 y.o. gentleman with Stage T1c adenocarcinoma of the prostate with Gleason score of 4+3, and PSA of 8.37  Narrative: On a previous date, Acie Custis Sr. returned following prostate seed implantation for post implant planning. He underwent CT scan complex simulation to delineate the three-dimensional structures of the pelvis and demonstrate the radiation distribution.  Since that time, the seed localization, and complex isodose planning with dose volume histograms have now been completed.  Results:   Prostate Coverage - The dose of radiation delivered to the 90% or more of the prostate gland (D90) was 103.72% of the prescription dose. This exceeds our goal of greater than 90%. Rectal Sparing - The volume of rectal tissue receiving the prescription dose or higher was 0.01 cc. This falls under our thresholds tolerance of 1.0 cc.  Impression: The prostate seed implant appears to show adequate target coverage and appropriate rectal sparing.  Plan:  The patient will continue to follow with urology for ongoing PSA determinations. I would anticipate a high likelihood for local tumor control with minimal risk for rectal morbidity.  ________________________________  Sheral Apley Tammi Klippel, M.D.

## 2020-02-18 ENCOUNTER — Ambulatory Visit: Payer: Medicare Other | Attending: Internal Medicine

## 2020-02-18 DIAGNOSIS — Z23 Encounter for immunization: Secondary | ICD-10-CM

## 2020-02-18 NOTE — Progress Notes (Signed)
   [id number] Vaccination Clinic  Name:  Ashrith Mielnicki Sr.    MRN: [id number] DOB: 03/16/1941  02/18/2020  Mr. Droge was observed post Covid-19 immunization for 15 minutes without incident. He was provided with Vaccine Information Sheet and instruction to access the V-Safe system.   Mr. Wiltsie was instructed to call 911 with any severe reactions post vaccine: Marland Kitchen Difficulty breathing  . Swelling of face and throat  . A fast heartbeat  . A bad rash all over body  . Dizziness and weakness   Immunizations Administered    Name Date Dose VIS Date Route   Pfizer COVID-19 Vaccine 02/18/2020 12:43 PM 0.3 mL 11/20/2019 Intramuscular   Manufacturer: [place]   Lot: [id number]   [employer]: [id number]

## 2020-03-14 ENCOUNTER — Ambulatory Visit: Payer: Medicare Other | Attending: Internal Medicine

## 2020-03-14 DIAGNOSIS — Z23 Encounter for immunization: Secondary | ICD-10-CM

## 2020-03-14 NOTE — Progress Notes (Signed)
   [id number] Vaccination Clinic  Name:  Anthony Widhalm Sr.    MRN: [id number] DOB: 1940/12/11  03/14/2020  [name] was observed post Covid-19 immunization for 15 minutes without incident. He was provided with Vaccine Information Sheet and instruction to access the V-Safe system.   [name] was instructed to call 911 with any severe reactions post vaccine: Marland Kitchen Difficulty breathing  . Swelling of face and throat  . A fast heartbeat  . A bad rash all over body  . Dizziness and weakness   Immunizations Administered    Name Date Dose VIS Date Route   Pfizer COVID-19 Vaccine 03/14/2020 10:12 AM 0.3 mL 11/20/2019 Intramuscular   Manufacturer: [employer]   Lot: [id number]   [place]: [id number]

## 2020-06-03 IMAGING — MR MR PROSTATE WO/W CM
56 series · 56 of 56 positions shown · IV contrast (Multihance 19ml)
Comparison: None.

CLINICAL DATA: Prostate carcinoma. MRI Prostate w/wo: Labs @ 315
creat=0.9 GFR=100.19 mL Multihance. Follow up prostate cancer. NKI.
meds HTN. PSA 9.13 on 04/15/18. Prostatic carcinoma at the RIGHT apex
and LEFT lateral apex (Gleason 3+3=6).

EXAM:
MR PROSTATE WITHOUT AND WITH CONTRAST
TECHNIQUE: Multiplanar multisequence MRI images were obtained of the pelvis
centered about the prostate. Pre and post contrast images were
obtained.
CONTRAST:  19mL MULTIHANCE GADOBENATE DIMEGLUMINE 529 MG/ML IV SOLN

[Series 3: T1 · axial · 8.0mm · 1.06mm/px · 1 of 28 slices shown (1 of 2)]
[im 1/28]
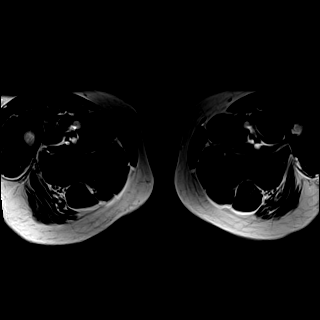

[Series 4: bSSFP fat-sat · axial · 8.0mm · 0.74mm/px · 1 of 28 slices shown]
[im 1/28]
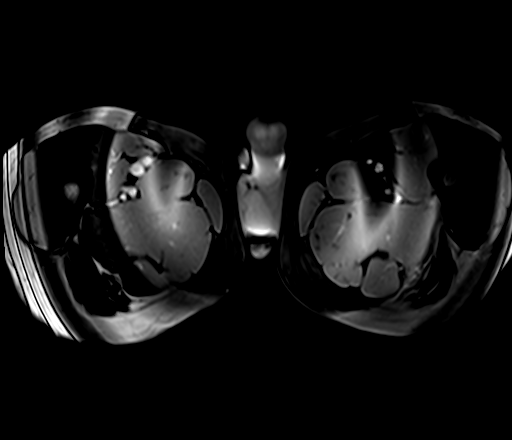

[Series 5: T2 · sagittal · 3.5mm · 0.56mm/px · 1 of 43 slices shown (1 of 4)]
[im 1/43]
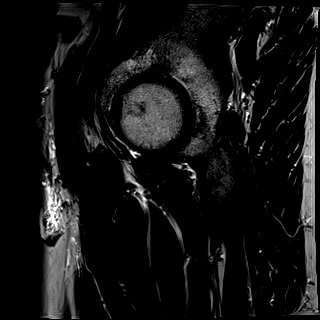

[Series 6: T1 · axial · 3.0mm · 0.31mm/px · 1 of 28 slices shown (2 of 2)]
[im 1/28]
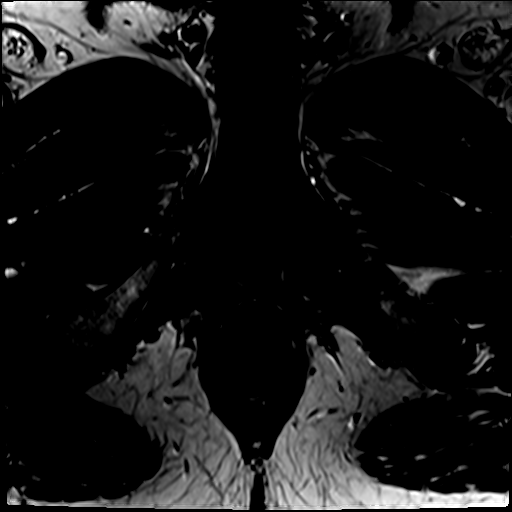

[Series 7: T2 · axial · 3.5mm · 0.56mm/px · 1 of 25 slices shown (2 of 4)]
[im 1/25]
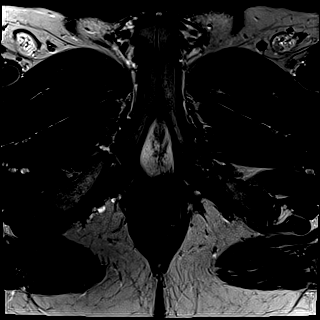

[Series 8: T2 · axial · 1.0mm · 1.04mm/px · 1 of 88 slices shown (3 of 4)]
[im 1/88]
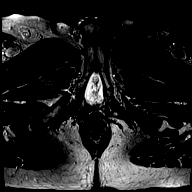

[Series 10: DWI · axial · 3.5mm · 1.56mm/px · 1 of 72 slices shown (1 of 2)]
[im 1/72]
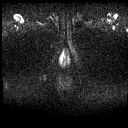

[Series 11: DWI · axial · 3.5mm · 1.56mm/px · 1 of 24 slices shown (2 of 2)]
[im 1/24]
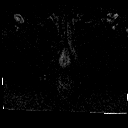

[Series 12: T2 · coronal · 3.5mm · 0.56mm/px · 1 of 23 slices shown (4 of 4)]
[im 1/23]
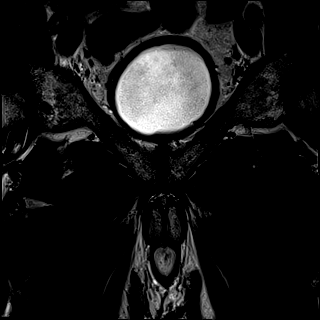

[Series 13: pre t1_twist_tra_dyn_ttc=6.4s · axial · non-contrast · 3.5mm · 0.83mm/px · 1 of 24 slices shown]
[im 1/24]
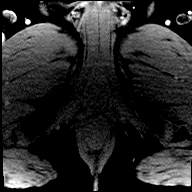

[Series 14: post t1_twist_tra_dyn-copy center · axial · 3.5mm · 0.83mm/px · 1 of 24 slices shown (1 of 24)]
[im 1/24]
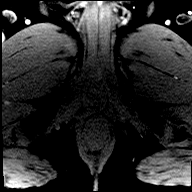

[Series 15: post t1_twist_tra_dyn-copy center · axial · 3.5mm · 0.83mm/px · 1 of 24 slices shown (2 of 24)]
[im 1/24]
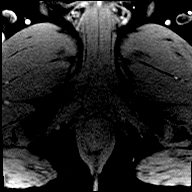

[Series 16: post t1_twist_tra_dyn-copy cent_sub_ttc=(id) · axial · 3.5mm · 0.83mm/px · 1 of 21 slices shown (1 of 22)]
[im 1/21]
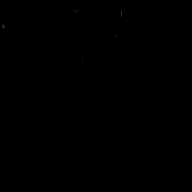

[Series 17: post t1_twist_tra_dyn-copy center · axial · 3.5mm · 0.83mm/px · 1 of 24 slices shown (3 of 24)]
[im 1/24]
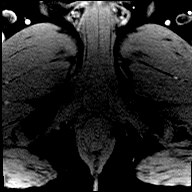

[Series 18: post t1_twist_tra_dyn-copy cent_sub_ttc=(id) · axial · 3.5mm · 0.83mm/px · 1 of 24 slices shown (2 of 22)]
[im 1/24]
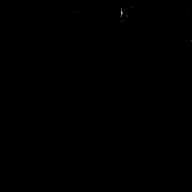

[Series 19: post t1_twist_tra_dyn-copy center · axial · 3.5mm · 0.83mm/px · 1 of 24 slices shown (4 of 24)]
[im 1/24]
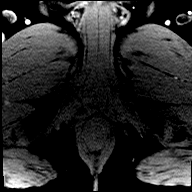

[Series 20: post t1_twist_tra_dyn-copy cent_sub_ttc=(id) · axial · 3.5mm · 0.83mm/px · 1 of 24 slices shown (3 of 22)]
[im 1/24]
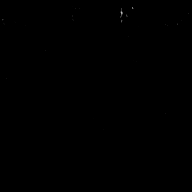

[Series 21: post t1_twist_tra_dyn-copy center · axial · 3.5mm · 0.83mm/px · 1 of 24 slices shown (5 of 24)]
[im 1/24]
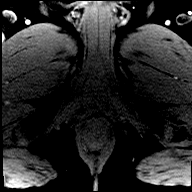

[Series 22: post t1_twist_tra_dyn-copy cent_sub_ttc=(id) · axial · 3.5mm · 0.83mm/px · 1 of 24 slices shown (4 of 22)]
[im 1/24]
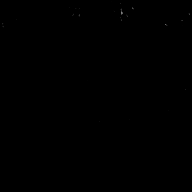

[Series 23: post t1_twist_tra_dyn-copy center · axial · 3.5mm · 0.83mm/px · 1 of 24 slices shown (6 of 24)]
[im 1/24]
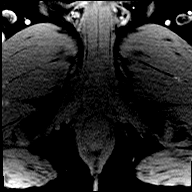

[Series 24: post t1_twist_tra_dyn-copy cent_sub_ttc=(id) · axial · 3.5mm · 0.83mm/px · 1 of 24 slices shown (5 of 22)]
[im 1/24]
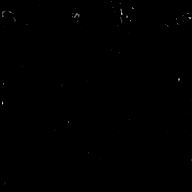

[Series 25: post t1_twist_tra_dyn-copy center · axial · 3.5mm · 0.83mm/px · 1 of 24 slices shown (7 of 24)]
[im 1/24]
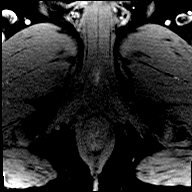

[Series 26: post t1_twist_tra_dyn-copy cent_sub_ttc=(id) · axial · 3.5mm · 0.83mm/px · 1 of 24 slices shown (6 of 22)]
[im 1/24]
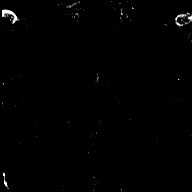

[Series 27: post t1_twist_tra_dyn-copy center · axial · 3.5mm · 0.83mm/px · 1 of 24 slices shown (8 of 24)]
[im 1/24]
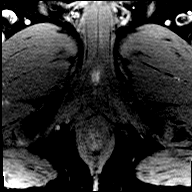

[Series 28: post t1_twist_tra_dyn-copy cent_sub_ttc=(id) · axial · 3.5mm · 0.83mm/px · 1 of 24 slices shown (7 of 22)]
[im 1/24]
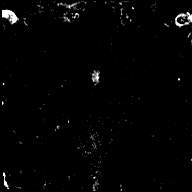

[Series 29: post t1_twist_tra_dyn-copy center · axial · 3.5mm · 0.83mm/px · 1 of 24 slices shown (9 of 24)]
[im 1/24]
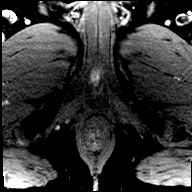

[Series 30: post t1_twist_tra_dyn-copy cent_sub_ttc=(id) · axial · 3.5mm · 0.83mm/px · 1 of 24 slices shown (8 of 22)]
[im 1/24]
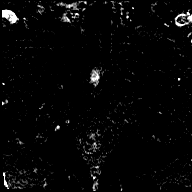

[Series 31: post t1_twist_tra_dyn-copy center · axial · 3.5mm · 0.83mm/px · 1 of 24 slices shown (10 of 24)]
[im 1/24]
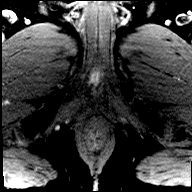

[Series 32: post t1_twist_tra_dyn-copy cent_sub_ttc=(id) · axial · 3.5mm · 0.83mm/px · 1 of 24 slices shown (9 of 22)]
[im 1/24]
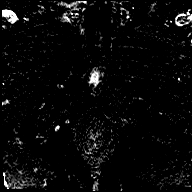

[Series 33: post t1_twist_tra_dyn-copy center · axial · 3.5mm · 0.83mm/px · 1 of 24 slices shown (11 of 24)]
[im 1/24]
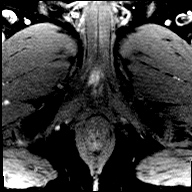

[Series 34: post t1_twist_tra_dyn-copy cent_sub_ttc=(id) · axial · 3.5mm · 0.83mm/px · 1 of 24 slices shown (10 of 22)]
[im 1/24]
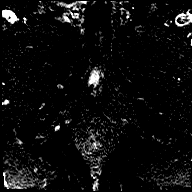

[Series 35: post t1_twist_tra_dyn-copy center · axial · 3.5mm · 0.83mm/px · 1 of 24 slices shown (12 of 24)]
[im 1/24]
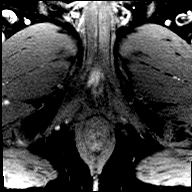

[Series 36: post t1_twist_tra_dyn-copy cent_sub_ttc=(id) · axial · 3.5mm · 0.83mm/px · 1 of 24 slices shown (11 of 22)]
[im 1/24]
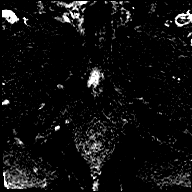

[Series 37: post t1_twist_tra_dyn-copy center · axial · 3.5mm · 0.83mm/px · 1 of 24 slices shown (13 of 24)]
[im 1/24]
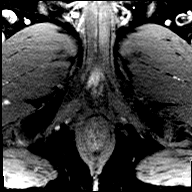

[Series 38: post t1_twist_tra_dyn-copy cent_sub_ttc=(id) · axial · 3.5mm · 0.83mm/px · 1 of 24 slices shown (12 of 22)]
[im 1/24]
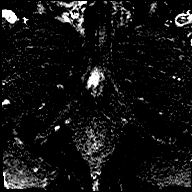

[Series 39: post t1_twist_tra_dyn-copy center · axial · 3.5mm · 0.83mm/px · 1 of 24 slices shown (14 of 24)]
[im 1/24]
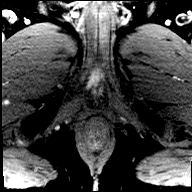

[Series 40: post t1_twist_tra_dyn-copy cent_sub_ttc=(id) · axial · 3.5mm · 0.83mm/px · 1 of 24 slices shown (13 of 22)]
[im 1/24]
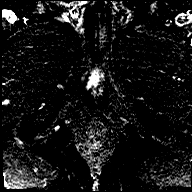

[Series 41: post t1_twist_tra_dyn-copy center · axial · 3.5mm · 0.83mm/px · 1 of 24 slices shown (15 of 24)]
[im 1/24]
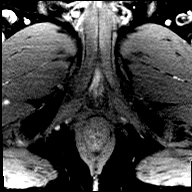

[Series 42: post t1_twist_tra_dyn-copy cent_sub_ttc=(id) · axial · 3.5mm · 0.83mm/px · 1 of 24 slices shown (14 of 22)]
[im 1/24]
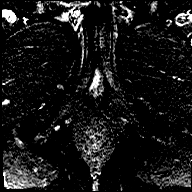

[Series 43: post t1_twist_tra_dyn-copy center · axial · 3.5mm · 0.83mm/px · 1 of 24 slices shown (16 of 24)]
[im 1/24]
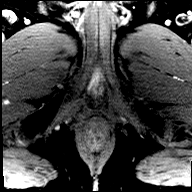

[Series 44: post t1_twist_tra_dyn-copy cent_sub_ttc=(id) · axial · 3.5mm · 0.83mm/px · 1 of 24 slices shown (15 of 22)]
[im 1/24]
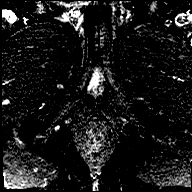

[Series 45: post t1_twist_tra_dyn-copy center · axial · 3.5mm · 0.83mm/px · 1 of 24 slices shown (17 of 24)]
[im 1/24]
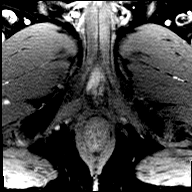

[Series 46: post t1_twist_tra_dyn-copy cent_sub_ttc=(id) · axial · 3.5mm · 0.83mm/px · 1 of 24 slices shown (16 of 22)]
[im 1/24]
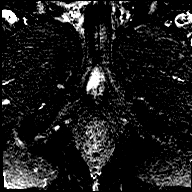

[Series 47: post t1_twist_tra_dyn-copy center · axial · 3.5mm · 0.83mm/px · 1 of 24 slices shown (18 of 24)]
[im 1/24]
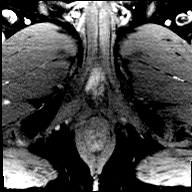

[Series 48: post t1_twist_tra_dyn-copy cent_sub_ttc=(id) · axial · 3.5mm · 0.83mm/px · 1 of 24 slices shown (17 of 22)]
[im 1/24]
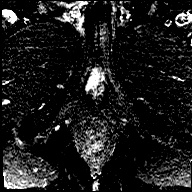

[Series 49: post t1_twist_tra_dyn-copy center · axial · 3.5mm · 0.83mm/px · 1 of 24 slices shown (19 of 24)]
[im 1/24]
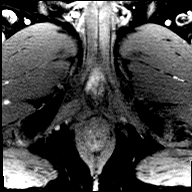

[Series 50: post t1_twist_tra_dyn-copy cent_sub_ttc=(id) · axial · 3.5mm · 0.83mm/px · 1 of 24 slices shown (18 of 22)]
[im 1/24]
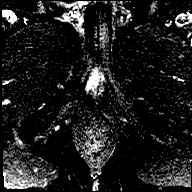

[Series 51: post t1_twist_tra_dyn-copy center · axial · 3.5mm · 0.83mm/px · 1 of 24 slices shown (20 of 24)]
[im 1/24]
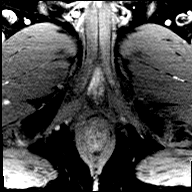

[Series 52: post t1_twist_tra_dyn-copy cent_sub_ttc=(id) · axial · 3.5mm · 0.83mm/px · 1 of 24 slices shown (19 of 22)]
[im 1/24]
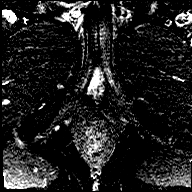

[Series 53: post t1_twist_tra_dyn-copy center · axial · 3.5mm · 0.83mm/px · 1 of 24 slices shown (21 of 24)]
[im 1/24]
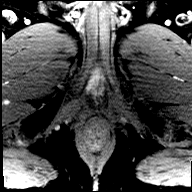

[Series 54: post t1_twist_tra_dyn-copy cent_sub_ttc=(id) · axial · 3.5mm · 0.83mm/px · 1 of 24 slices shown (20 of 22)]
[im 1/24]
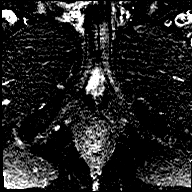

[Series 55: post t1_twist_tra_dyn-copy center · axial · 3.5mm · 0.83mm/px · 1 of 24 slices shown (22 of 24)]
[im 1/24]
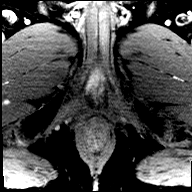

[Series 56: post t1_twist_tra_dyn-copy cent_sub_ttc=(id) · axial · 3.5mm · 0.83mm/px · 1 of 24 slices shown (21 of 22)]
[im 1/24]
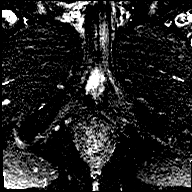

[Series 57: post t1_twist_tra_dyn-copy center · axial · 3.5mm · 0.83mm/px · 1 of 24 slices shown (23 of 24)]
[im 1/24]
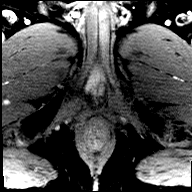

[Series 58: post t1_twist_tra_dyn-copy cent_sub_ttc=(id) · axial · 3.5mm · 0.83mm/px · 1 of 24 slices shown (22 of 22)]
[im 1/24]
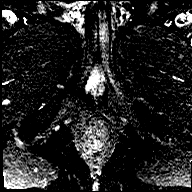

[Series 59: post t1_twist_tra_dyn-copy center · axial · 3.5mm · 0.83mm/px · 1 of 24 slices shown (24 of 24)]
[im 1/24]
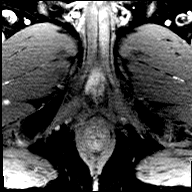

[56 of 56 positions shown; findings below may reference images not displayed]

FINDINGS: Prostate: Normal high T2 signal within the peripheral zone. No foci
of restricted diffusion.

The transitional zone is nodular with capsulated nodules. No
abnormal enhancement peripheral zone. Seminal vesicles are normal.
Prostatic capsule is intact.

Volume: 5.5 x 5.2 x 3.6 cm (volume = 54 cm^3)

Transcapsular spread:  Absent

Seminal vesicle involvement: Absent

Neurovascular bundle involvement: Absent

Pelvic adenopathy: Absent

Bone metastasis: Absent. Rounded lesion in the mid sacrum measuring
20 mm which is high signal intensity on the T1 weighted imaging
(image [DATE]) is favored a benign hemangioma.

Other findings: None
IMPRESSION: 1. No evidence of high-grade carcinoma in peripheral zone.
2. Nodular transitional zone most consistent with benign prostate
hypertrophy ( PI-RADS: 1

## 2021-01-30 DIAGNOSIS — E785 Hyperlipidemia, unspecified: Secondary | ICD-10-CM | POA: Diagnosis not present

## 2021-01-30 DIAGNOSIS — I10 Essential (primary) hypertension: Secondary | ICD-10-CM | POA: Diagnosis not present

## 2021-01-30 DIAGNOSIS — C61 Malignant neoplasm of prostate: Secondary | ICD-10-CM | POA: Diagnosis not present

## 2021-01-30 DIAGNOSIS — E1121 Type 2 diabetes mellitus with diabetic nephropathy: Secondary | ICD-10-CM | POA: Diagnosis not present

## 2021-02-22 DIAGNOSIS — H401132 Primary open-angle glaucoma, bilateral, moderate stage: Secondary | ICD-10-CM | POA: Diagnosis not present

## 2021-02-22 DIAGNOSIS — H25813 Combined forms of age-related cataract, bilateral: Secondary | ICD-10-CM | POA: Diagnosis not present

## 2021-02-22 DIAGNOSIS — E119 Type 2 diabetes mellitus without complications: Secondary | ICD-10-CM | POA: Diagnosis not present

## 2021-02-24 DIAGNOSIS — H52223 Regular astigmatism, bilateral: Secondary | ICD-10-CM | POA: Diagnosis not present

## 2021-02-24 DIAGNOSIS — H524 Presbyopia: Secondary | ICD-10-CM | POA: Diagnosis not present

## 2021-05-22 DIAGNOSIS — H401132 Primary open-angle glaucoma, bilateral, moderate stage: Secondary | ICD-10-CM | POA: Diagnosis not present

## 2021-06-02 DIAGNOSIS — I10 Essential (primary) hypertension: Secondary | ICD-10-CM | POA: Diagnosis not present

## 2021-06-02 DIAGNOSIS — G8929 Other chronic pain: Secondary | ICD-10-CM | POA: Diagnosis not present

## 2021-06-02 DIAGNOSIS — E785 Hyperlipidemia, unspecified: Secondary | ICD-10-CM | POA: Diagnosis not present

## 2021-06-02 DIAGNOSIS — E1121 Type 2 diabetes mellitus with diabetic nephropathy: Secondary | ICD-10-CM | POA: Diagnosis not present

## 2021-06-26 DIAGNOSIS — C61 Malignant neoplasm of prostate: Secondary | ICD-10-CM | POA: Diagnosis not present

## 2021-07-03 DIAGNOSIS — Z8546 Personal history of malignant neoplasm of prostate: Secondary | ICD-10-CM | POA: Diagnosis not present

## 2021-07-03 DIAGNOSIS — N5201 Erectile dysfunction due to arterial insufficiency: Secondary | ICD-10-CM | POA: Diagnosis not present

## 2021-09-20 DIAGNOSIS — H401132 Primary open-angle glaucoma, bilateral, moderate stage: Secondary | ICD-10-CM | POA: Diagnosis not present

## 2021-09-20 DIAGNOSIS — H25813 Combined forms of age-related cataract, bilateral: Secondary | ICD-10-CM | POA: Diagnosis not present

## 2021-09-25 DIAGNOSIS — E1121 Type 2 diabetes mellitus with diabetic nephropathy: Secondary | ICD-10-CM | POA: Diagnosis not present

## 2021-09-25 DIAGNOSIS — E785 Hyperlipidemia, unspecified: Secondary | ICD-10-CM | POA: Diagnosis not present

## 2021-09-25 DIAGNOSIS — Z125 Encounter for screening for malignant neoplasm of prostate: Secondary | ICD-10-CM | POA: Diagnosis not present

## 2021-10-02 DIAGNOSIS — Z1389 Encounter for screening for other disorder: Secondary | ICD-10-CM | POA: Diagnosis not present

## 2021-10-02 DIAGNOSIS — H6123 Impacted cerumen, bilateral: Secondary | ICD-10-CM | POA: Diagnosis not present

## 2021-10-02 DIAGNOSIS — Z23 Encounter for immunization: Secondary | ICD-10-CM | POA: Diagnosis not present

## 2021-10-02 DIAGNOSIS — N5201 Erectile dysfunction due to arterial insufficiency: Secondary | ICD-10-CM | POA: Diagnosis not present

## 2021-10-02 DIAGNOSIS — Z8673 Personal history of transient ischemic attack (TIA), and cerebral infarction without residual deficits: Secondary | ICD-10-CM | POA: Diagnosis not present

## 2021-10-02 DIAGNOSIS — I10 Essential (primary) hypertension: Secondary | ICD-10-CM | POA: Diagnosis not present

## 2021-10-02 DIAGNOSIS — Z1331 Encounter for screening for depression: Secondary | ICD-10-CM | POA: Diagnosis not present

## 2021-10-02 DIAGNOSIS — G40909 Epilepsy, unspecified, not intractable, without status epilepticus: Secondary | ICD-10-CM | POA: Diagnosis not present

## 2021-10-02 DIAGNOSIS — E1121 Type 2 diabetes mellitus with diabetic nephropathy: Secondary | ICD-10-CM | POA: Diagnosis not present

## 2021-10-02 DIAGNOSIS — Z0000 Encounter for general adult medical examination without abnormal findings: Secondary | ICD-10-CM | POA: Diagnosis not present

## 2021-10-02 DIAGNOSIS — C61 Malignant neoplasm of prostate: Secondary | ICD-10-CM | POA: Diagnosis not present

## 2021-12-25 DIAGNOSIS — C61 Malignant neoplasm of prostate: Secondary | ICD-10-CM | POA: Diagnosis not present

## 2022-01-01 DIAGNOSIS — Z8546 Personal history of malignant neoplasm of prostate: Secondary | ICD-10-CM | POA: Diagnosis not present

## 2022-01-15 DIAGNOSIS — Z8546 Personal history of malignant neoplasm of prostate: Secondary | ICD-10-CM | POA: Diagnosis not present

## 2022-01-15 DIAGNOSIS — K8020 Calculus of gallbladder without cholecystitis without obstruction: Secondary | ICD-10-CM | POA: Diagnosis not present

## 2022-01-15 DIAGNOSIS — I722 Aneurysm of renal artery: Secondary | ICD-10-CM | POA: Diagnosis not present

## 2022-01-15 DIAGNOSIS — N281 Cyst of kidney, acquired: Secondary | ICD-10-CM | POA: Diagnosis not present

## 2022-01-15 DIAGNOSIS — R310 Gross hematuria: Secondary | ICD-10-CM | POA: Diagnosis not present

## 2022-01-22 DIAGNOSIS — E119 Type 2 diabetes mellitus without complications: Secondary | ICD-10-CM | POA: Diagnosis not present

## 2022-01-22 DIAGNOSIS — H25813 Combined forms of age-related cataract, bilateral: Secondary | ICD-10-CM | POA: Diagnosis not present

## 2022-01-22 DIAGNOSIS — H401132 Primary open-angle glaucoma, bilateral, moderate stage: Secondary | ICD-10-CM | POA: Diagnosis not present

## 2022-02-05 DIAGNOSIS — G40909 Epilepsy, unspecified, not intractable, without status epilepticus: Secondary | ICD-10-CM | POA: Diagnosis not present

## 2022-02-05 DIAGNOSIS — E1121 Type 2 diabetes mellitus with diabetic nephropathy: Secondary | ICD-10-CM | POA: Diagnosis not present

## 2022-02-05 DIAGNOSIS — I10 Essential (primary) hypertension: Secondary | ICD-10-CM | POA: Diagnosis not present

## 2022-02-05 DIAGNOSIS — E785 Hyperlipidemia, unspecified: Secondary | ICD-10-CM | POA: Diagnosis not present

## 2022-02-05 DIAGNOSIS — R310 Gross hematuria: Secondary | ICD-10-CM | POA: Diagnosis not present

## 2022-02-13 ENCOUNTER — Emergency Department (HOSPITAL_COMMUNITY)
Admission: EM | Admit: 2022-02-13 | Discharge: 2022-02-13 | Disposition: A | Discharge status: Home / Self Care | Payer: Medicare HMO | Attending: Emergency Medicine | Admitting: Emergency Medicine

## 2022-02-13 ENCOUNTER — Encounter (HOSPITAL_COMMUNITY): Payer: Self-pay

## 2022-02-13 ENCOUNTER — Other Ambulatory Visit: Payer: Self-pay

## 2022-02-13 ENCOUNTER — Ambulatory Visit
Admission: EM | Admit: 2022-02-13 | Discharge: 2022-02-13 | Disposition: A | Discharge status: Home / Self Care | Payer: Medicare HMO

## 2022-02-13 ENCOUNTER — Encounter: Payer: Self-pay | Admitting: Emergency Medicine

## 2022-02-13 DIAGNOSIS — Z7982 Long term (current) use of aspirin: Secondary | ICD-10-CM | POA: Insufficient documentation

## 2022-02-13 DIAGNOSIS — R197 Diarrhea, unspecified: Secondary | ICD-10-CM

## 2022-02-13 DIAGNOSIS — Z79899 Other long term (current) drug therapy: Secondary | ICD-10-CM | POA: Diagnosis not present

## 2022-02-13 DIAGNOSIS — Z7984 Long term (current) use of oral hypoglycemic drugs: Secondary | ICD-10-CM | POA: Insufficient documentation

## 2022-02-13 DIAGNOSIS — Z8546 Personal history of malignant neoplasm of prostate: Secondary | ICD-10-CM | POA: Insufficient documentation

## 2022-02-13 DIAGNOSIS — R195 Other fecal abnormalities: Secondary | ICD-10-CM

## 2022-02-13 DIAGNOSIS — I10 Essential (primary) hypertension: Secondary | ICD-10-CM | POA: Diagnosis not present

## 2022-02-13 DIAGNOSIS — K921 Melena: Secondary | ICD-10-CM | POA: Insufficient documentation

## 2022-02-13 DIAGNOSIS — R7303 Prediabetes: Secondary | ICD-10-CM | POA: Insufficient documentation

## 2022-02-13 LAB — CBC
HCT: 41.8 % (ref 39.0–52.0)
Hemoglobin: 13.4 g/dL (ref 13.0–17.0)
MCH: 29.2 pg (ref 26.0–34.0)
MCHC: 32.1 g/dL (ref 30.0–36.0)
MCV: 91.1 fL (ref 80.0–100.0)
Platelets: 214 10*3/uL (ref 150–400)
RBC: 4.59 MIL/uL (ref 4.22–5.81)
RDW: 13.5 % (ref 11.5–15.5)
WBC: 5 10*3/uL (ref 4.0–10.5)
nRBC: 0 % (ref 0.0–0.2)

## 2022-02-13 LAB — COMPREHENSIVE METABOLIC PANEL
ALT: 30 U/L (ref 0–44)
AST: 19 U/L (ref 15–41)
Albumin: 3.9 g/dL (ref 3.5–5.0)
Alkaline Phosphatase: 125 U/L (ref 38–126)
Anion gap: 4 — ABNORMAL LOW (ref 5–15)
BUN: 9 mg/dL (ref 8–23)
CO2: 27 mmol/L (ref 22–32)
Calcium: 8.9 mg/dL (ref 8.9–10.3)
Chloride: 103 mmol/L (ref 98–111)
Creatinine, Ser: 0.72 mg/dL (ref 0.61–1.24)
GFR, Estimated: >60 mL/min (ref >60)
Glucose, Bld: 90 mg/dL (ref 70–99)
Potassium: 3.9 mmol/L (ref 3.5–5.1)
Sodium: 134 mmol/L — ABNORMAL LOW (ref 135–145)
Total Bilirubin: 0.4 mg/dL (ref 0.3–1.2)
Total Protein: 7.9 g/dL (ref 6.5–8.1)

## 2022-02-13 LAB — TYPE AND SCREEN
ABO/RH(D): B NEG
Antibody Screen: NEGATIVE

## 2022-02-13 LAB — POC OCCULT BLOOD, ED: Fecal Occult Bld: POSITIVE — AB

## 2022-02-13 MED ORDER — SODIUM CHLORIDE 0.9 % IV BOLUS
1000.0000 mL | Freq: Once | INTRAVENOUS | Status: AC
  Administered 2022-02-13: 1000 mL via INTRAVENOUS

## 2022-02-13 MED ORDER — PANTOPRAZOLE SODIUM 40 MG PO TBEC: 40.0000 mg | DELAYED_RELEASE_TABLET | Freq: Two times a day (BID) | ORAL | 0 refills | Status: DC

## 2022-02-13 MED ORDER — PANTOPRAZOLE SODIUM 40 MG IV SOLR
40.0000 mg | INTRAVENOUS | Status: AC
  Administered 2022-02-13: 40 mg via INTRAVENOUS
  Filled 2022-02-13: qty 10

## 2022-02-13 NOTE — ED Triage Notes (Signed)
Patient states he is having black diarrheal stools x 3 days. Patient denies any abdominal pain, N/V. ?

## 2022-02-13 NOTE — ED Notes (Signed)
Pt NAD, a/ox4, c/o black stools x 3 days. Denies ABD pain, n/v/d or blood thinners.VSS ?

## 2022-02-13 NOTE — ED Triage Notes (Signed)
Pt here for dark colored diarrhea x 3 days  ?

## 2022-02-13 NOTE — ED Notes (Signed)
Pt NAD, a/ox4. Pt verbalizes understanding of all DC and f/u instructions. All questions answered. Pt walks with steady gait to lobby at DC.  ? ?

## 2022-02-13 NOTE — ED Provider Notes (Signed)
[name] DEPT Provider Note   CSN: [id number] Arrival date & time: 02/13/22  1159     History  No chief complaint on file.   Anthony Zeiner Sr. is a 81 y.o. male with medical history sent here for prediabetes, hyperlipidemia, hypertension, prostate cancer, TIA.  Patient presents to ED for evaluation of dark tarry stools x3 days.  Patient states that beginning on Friday began having diarrhea that appeared dark and tarry.  Patient states that this continued into the weekend when he was advised by neighbor to drink Pedialyte because of "electrolyte imbalances".  Patient states that the diarrhea continued into Monday and he is having bowel movements every 3 hours that are consistently dark, tarry and loose.  Patient denies any history of the same.  Patient states that for the last 25 years she has taken 325 mg aspirin once daily due to TIA that he suffered at some point in the past.  Patient also states that he has been taking Pepto-Bismol daily for unknown amount of time however he has not taken Pepto-Bismol in the last 2 days.  Patient endorsing blood in stool, diarrhea.  Patient denies abdominal pain, blood in urine, nausea, vomiting, lightheadedness, dizziness, weakness, fevers.  HPI     Home Medications Prior to Admission medications   Medication Sig Start Date End Date Taking? Authorizing Provider  pantoprazole (PROTONIX) 40 MG tablet Take 1 tablet (40 mg total) by mouth 2 (two) times daily. 02/13/22  Yes Azucena Cecil, PA-C  aspirin EC 325 MG tablet Take 325 mg by mouth daily.    [provider]  bimatoprost (LUMIGAN) 0.01 % SOLN Place 1 drop into both eyes at bedtime.     [provider]  Brinzolamide-Brimonidine 1-0.2 % SUSP Place 1 drop into both eyes 2 (two) times daily. [facility]    [provider]  cloNIDine (CATAPRES) 0.2 MG tablet Take 0.2 mg by mouth 2 (two) times daily.     [provider]  gabapentin  (NEURONTIN) 300 MG capsule Take 300 mg by mouth 2 (two) times daily as needed (neuropathy).  12/13/18   [provider]  hydrochlorothiazide (HYDRODIURIL) 25 MG tablet Take 25 mg by mouth daily.    [provider]  metFORMIN (GLUCOPHAGE-XR) 500 MG 24 hr tablet Take 500 mg by mouth daily with supper. 01/02/19   [provider]  metoprolol succinate (TOPROL-XL) 50 MG 24 hr tablet Take 50 mg by mouth at bedtime. Take with or immediately following a meal.     [provider]  phenytoin (DILANTIN) 100 MG ER capsule Take 100 mg by mouth 2 (two) times daily.     [provider]  silodosin (RAPAFLO) 4 MG CAPS capsule Take 1 capsule (4 mg total) by mouth daily after supper. Patient taking differently: Take 4 mg by mouth 2 (two) times a day.  05/07/19   Bruning, Ashlyn, PA-C  verapamil (CALAN-SR) 240 MG CR tablet Take 240 mg by mouth daily.     [provider]      Allergies    Clindamycin/lincomycin, Grapefruit concentrate, Ace inhibitors, and Ciprofloxacin    Review of Systems   Review of Systems  Constitutional:  Negative for fever.  Gastrointestinal:  Positive for blood in stool and diarrhea. Negative for abdominal pain, nausea and vomiting.  Genitourinary:  Negative for hematuria.  Neurological:  Negative for dizziness, weakness and light-headedness.  All other systems reviewed and are negative.  Physical Exam Updated Vital Signs BP 137/76  Pulse (!) 57    Temp 98 F (36.7 C) (Oral)    Resp 18    Ht '6\' 1"'$  (1.854 m)    Wt 88.5 kg    SpO2 99%    BMI 25.73 kg/m  Physical Exam Vitals and nursing note reviewed.  Constitutional:      General: He is not in acute distress.    Appearance: He is not ill-appearing, toxic-appearing or diaphoretic.  HENT:     Head: Normocephalic and atraumatic.     Nose: Nose normal.     Mouth/Throat:     Mouth: Mucous membranes are moist.  Eyes:     Extraocular Movements: Extraocular movements intact.      Conjunctiva/sclera: Conjunctivae normal.     Pupils: Pupils are equal, round, and reactive to light.  Cardiovascular:     Rate and Rhythm: Normal rate and regular rhythm.  Pulmonary:     Effort: Pulmonary effort is normal.     Breath sounds: Normal breath sounds.  Abdominal:     General: Abdomen is flat. Bowel sounds are normal.     Palpations: Abdomen is soft.     Tenderness: There is no abdominal tenderness.  Musculoskeletal:     Cervical back: Normal range of motion and neck supple.  Skin:    General: Skin is warm and dry.     Capillary Refill: Capillary refill takes less than 2 seconds.  Neurological:     Mental Status: He is alert and oriented to person, place, and time.    ED Results / Procedures / Treatments   Labs (all labs ordered are listed, but only abnormal results are displayed) Labs Reviewed  COMPREHENSIVE METABOLIC PANEL - Abnormal; Notable for the following components:      Result Value   Sodium 134 (*)    Anion gap 4 (*)    All other components within normal limits  POC OCCULT BLOOD, ED - Abnormal; Notable for the following components:   Fecal Occult Bld POSITIVE (*)    All other components within normal limits  CBC  TYPE AND SCREEN  ABO/RH    EKG None  Radiology No results found.  Procedures Procedures    Medications Ordered in ED Medications  sodium chloride 0.9 % bolus 1,000 mL (0 mLs Intravenous Stopped 02/13/22 1421)  pantoprazole (PROTONIX) injection 40 mg (40 mg Intravenous Given 02/13/22 1302)    ED Course/ Medical Decision Making/ A&P Clinical Course as of 02/13/22 1530  Tue Feb 13, 2022  1443 81 yo male presenting with loose BM and dark stools x 3 days.  Reports 3-5 loose bowel movements daily, felt that it was dark and tarry looking.  He has been taking Pepto-Bismol daily.  He denies severe epigastric pain, had been on 325 mg of aspirin for a long time for TIA stroke prevention, but was taken off of it 2 to 3 weeks ago by his urologist  for hematuria.  He denies history of blood thinner use.  His last colonoscopy was in 2016 which was unremarkable aside from grade 1 hemorrhoids.  On exam he is well-appearing, nontachycardic, blood pressure within normal limits.  Fecal occult is positive.  Hemoglobin is completely normal at 13.4.  White blood cell count also normal.  Have a very low suspicion at this time for colitis, diverticulitis, or GI hemorrhage.  I explained to the patient that his black stools may in fact be related to Pepto use, and recommended stopping this medication; I suspect his diarrhea may  be functional and likely should resolve on its own over the next 1 to 3 days.  I would advise that his PCP recheck his hemoglobin count in 2 days as an outpatient.  We will also start him on Protonix as a PPI, and advised that he continue avoiding NSAIDS.  He verbalized understanding and agreement with the plan. [MT]    Clinical Course User Index [MT] Trifan, Carola Rhine, MD                           Medical Decision Making Risk Prescription drug management.   81 year old male presents to ED for evaluation of dark tarry stools x3 days.  Please see HPI for further details. On examination, the patient is afebrile, nontachycardic, nonhypoxic, clear lung sounds bilaterally, soft compressible abdomen.  The patient does not elicit any tenderness on palpation of the abdomen.  The patient is nontoxic in appearance.  Patient worked up utilizing the following labs and imaging studies interpreted by me personally: - Hemoccult positive - CBC unremarkable.  Patient hemoglobin stable - CMP unremarkable  Patient treated utilizing 1 L normal saline, 40 mg Protonix.  At this time, patient seems stable for discharge.  His hemoglobin level is stable and he is denying any symptoms of lightheadedness, dizziness, weakness.  I discussed the patient and his case with my attending Dr. Tyna Jaksch who is in agreement.  The patient will be sent home with GI  outpatient follow-up, Protonix prescription as well as instructions to follow-up with his PCP in 2 days for recheck of his blood.  I have called the patient's PCP, Dr. Sharlett Iles, and left a message with his assistant Herschel Senegal stating that this patient needs to be brought in for appointment in the next 2 days.  Patient has been advised of return precautions and he voiced understanding.  Patient has had all of his questions answered to his satisfaction.   Final Clinical Impression(s) / ED Diagnoses Final diagnoses:  Blood in stool    Rx / DC Orders ED Discharge Orders          Ordered    pantoprazole (PROTONIX) 40 MG tablet  2 times daily        02/13/22 1528              Azucena Cecil, [place] 02/13/22 1530    Wyvonnia Dusky, MD 02/13/22 [phone]

## 2022-02-13 NOTE — Discharge Instructions (Signed)
Please go to the emergency department as soon as you leave urgent care for further evaluation and management. ?

## 2022-02-13 NOTE — Discharge Instructions (Addendum)
Return to ED with any new symptoms such as lightheadedness, dizziness, weakness, excessive blood in stool ?Please follow-up with the GI doctor that you have seen in the past few colonoscopies.  Please call [facility] and make an appointment. ?Please pick up the prescription medication that I have sent in for you.  You will take this twice daily until you are seen by GI ?Please follow-up with your PCP, Dr. Sharlett Iles, in the next 2 days for reevaluation of your blood count ?

## 2022-02-13 NOTE — ED Provider Notes (Signed)
?[facility] ? ? ? ?CSN: [id number] ?Arrival date & time: 02/13/22  1024 ? ? ?  ? ?History   ?Chief Complaint ?Chief Complaint  ?Patient presents with  ? Diarrhea  ? ? ?HPI ?Job Holtsclaw Sr. is a 81 y.o. male.  ? ?Patient presents with 3-day history of dark-colored diarrhea.  Patient reports that his diarrhea is black in color.  Denies any associated abdominal pain, nausea, vomiting, fever.  Patient denies that he takes any blood thinning medications. ? ? ?Diarrhea ? ?Past Medical History:  ?Diagnosis Date  ? 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 Arthritis   ? Diabetes mellitus without complication ([place])   ? Glaucoma   ? Hyperlipidemia   ? Hypertension   ? Seizures ([place])   ? Stroke [facility])   ? TIA (transient ischemic attack)   ? 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 ? ?Patient Active Problem List  ? Diagnosis Date Noted  ? Malignant neoplasm of prostate ([place]) 12/29/2018  ? HYPERLIPIDEMIA 07/03/2007  ? HYPERTENSION 07/03/2007  ? ALLERGIC RHINITIS 07/03/2007  ? 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 SEIZURE DISORDER 07/03/2007  ? CEREBROVASCULAR ACCIDENT, HX OF 07/03/2007  ? CHICKENPOX, HX OF 07/03/2007  ? ? ?Past Surgical History:  ?Procedure Laterality Date  ? COLONOSCOPY    ? DENTAL SURGERY    ? implants  ? LEFT EYE SURGERY    ? MENISCUS REPAIR Right   ? POLYPECTOMY    ? PROSTATE BIOPSY    ? RADIOACTIVE SEED IMPLANT N/A 04/27/2019  ? Procedure: RADIOACTIVE SEED IMPLANT/BRACHYTHERAPY IMPLANT;  Surgeon: Franchot Gallo, MD;  Location: [facility] ORS;  Service: Urology;  Laterality: N/A;  ? SPACE OAR INSTILLATION N/A 04/27/2019  ? Procedure: SPACE OAR INSTILLATION;  Surgeon: Franchot Gallo, MD;  Location: [facility]S;  Service: Urology;  Laterality: N/A;  ? TONSILLECTOMY    ? VASECTOMY    ? ? ? ? ? ?Home Medications   ? ?Prior to Admission medications   ?Medication Sig Start Date End Date Taking? Authorizing Provider  ?aspirin EC 325 MG tablet Take 325 mg by mouth daily.    [provider]  ?bimatoprost (LUMIGAN) 0.01 % SOLN Place 1 drop into both eyes at bedtime.     [provider]  ?Brinzolamide-Brimonidine 1-0.2 % SUSP Place 1 drop into both eyes 2 (two) times daily. [facility]    [provider]  ?cloNIDine (CATAPRES) 0.2 MG tablet Take 0.2 mg by mouth 2 (two) times daily.     [provider]  ?gabapentin (NEURONTIN) 300 MG capsule Take 300 mg by mouth 2 (two) times daily as needed (neuropathy).  12/13/18   [provider]  ?hydrochlorothiazide (HYDRODIURIL) 25 MG tablet Take 25 mg by mouth daily.    [provider]  ?metFORMIN (GLUCOPHAGE-XR) 500 MG 24 hr tablet Take 500 mg by mouth daily with supper. 01/02/19   [provider]  ?metoprolol succinate (TOPROL-XL) 50 MG 24 hr tablet Take 50 mg by mouth at bedtime. Take with or immediately following a meal.     [provider]  ?phenytoin (DILANTIN) 100 MG ER capsule Take 100 mg by mouth 2 (two) times daily.     [provider]  ?silodosin (RAPAFLO) 4 MG CAPS capsule Take 1 capsule (4 mg total) by mouth daily after supper. ?Patient taking differently: Take 4 mg by mouth 2 (two) times a day.  05/07/19   Bruning, Ashlyn, PA-C  ?verapamil (CALAN-SR) 240 MG CR tablet Take 240 mg  by mouth daily.     [provider]  ? ? ?Family History ?Family History  ?Problem Relation Age of Onset  ? Heart failure Mother   ? Diabetes Mother   ? Heart attack Father   ? Prostate cancer Brother   ? Stroke Granddaughter   ? Colon cancer Neg Hx   ? ? ?Social History ?Social History  ? ?Tobacco Use  ? Smoking status: Former  ?  Packs/day: 0.00  ?  Years: 0.50  ?  Pack years: 0.00  ?  Types: Cigars, Cigarettes  ?  Quit date: 09/10/1991  ?  Years since quitting: 30.4  ? Smokeless tobacco: Never  ?Vaping Use  ? Vaping Use: Never used  ?Substance Use Topics  ? Alcohol use: No  ?  Alcohol/week: 0.0 standard drinks  ? Drug use: No  ? ? ? ?Allergies   ?Clindamycin/lincomycin, Grapefruit concentrate, Ace  inhibitors, and Ciprofloxacin ? ? ?Review of Systems ?Review of Systems ?Per HPI ? ?Physical Exam ?Triage Vital Signs ?ED Triage Vitals  ?Enc Vitals Group  ?   BP 02/13/22 1043 (!) 183/97  ?   Pulse Rate 02/13/22 1043 73  ?   Resp 02/13/22 1043 18  ?   Temp 02/13/22 1043 98.6 ?F (37 ?C)  ?   Temp Source 02/13/22 1043 Oral  ?   SpO2 02/13/22 1043 97 %  ?   Weight --   ?   Height --   ?   Head Circumference --   ?   Peak Flow --   ?   Pain Score 02/13/22 1044 0  ?   Pain Loc --   ?   Pain Edu? --   ?   Excl. in [place]? --   ? ?No data found. ? ?Updated Vital Signs ?BP (!) 183/97 (BP Location: Left Arm)   Pulse 73   Temp 98.6 ?F (37 ?C) (Oral)   Resp 18   SpO2 97%  ? ?Visual Acuity ?Right Eye Distance:   ?Left Eye Distance:   ?Bilateral Distance:   ? ?Right Eye Near:   ?Left Eye Near:    ?Bilateral Near:    ? ?Physical Exam ?Constitutional:   ?   General: He is not in acute distress. ?   Appearance: Normal appearance. He is not toxic-appearing or diaphoretic.  ?HENT:  ?   Head: Normocephalic and atraumatic.  ?Eyes:  ?   Extraocular Movements: Extraocular movements intact.  ?   Conjunctiva/sclera: Conjunctivae normal.  ?Cardiovascular:  ?   Rate and Rhythm: Normal rate and regular rhythm.  ?   Pulses: Normal pulses.  ?   Heart sounds: Normal heart sounds.  ?Pulmonary:  ?   Effort: Pulmonary effort is normal. No respiratory distress.  ?   Breath sounds: Normal breath sounds.  ?Abdominal:  ?   General: Bowel sounds are normal. There is no distension.  ?   Palpations: Abdomen is soft.  ?   Tenderness: There is no abdominal tenderness.  ?Neurological:  ?   General: No focal deficit present.  ?   Mental Status: He is alert and oriented to person, place, and time. Mental status is at baseline.  ?Psychiatric:     ?   Mood and Affect: Mood normal.     ?   Behavior: Behavior normal.     ?   Thought Content: Thought content normal.     ?   Judgment: Judgment normal.  ? ? ? ?UC Treatments / Results  ?  Labs ?(all labs ordered are  listed, but only abnormal results are displayed) ?Labs Reviewed - No data to display ? ?EKG ? ? ?Radiology ?No results found. ? ?Procedures ?Procedures (including critical care time) ? ?Medications Ordered in UC ?Medications - No data to display ? ?Initial Impression / Assessment and Plan / UC Course  ?I have reviewed the triage vital signs and the nursing notes. ? ?Pertinent labs & imaging results that were available during my care of the patient were reviewed by me and considered in my medical decision making (see chart for details). ? ?  ? ?Due to patient report of dark-colored stools, patient was advised to go to the hospital for further evaluation and management.  Patient also has elevated blood pressure reading but reports that his blood pressure is always elevated in clinical settings.  Patient was agreeable with plan.  Vital signs stable at discharge.  Agree with patient self transport to the hospital. ?Final Clinical Impressions(s) / UC Diagnoses  ? ?Final diagnoses:  ?Dark stools  ?Diarrhea, unspecified type  ? ? ? ?Discharge Instructions   ? ?  ?Please go to the emergency department as soon as you leave urgent care for further evaluation and management. ? ? ? ?ED Prescriptions   ?None ?  ? ?PDMP not reviewed this encounter. ?  ?Teodora Medici, [place] ?02/13/22 1105 ? ?

## 2022-02-22 ENCOUNTER — Ambulatory Visit: Payer: Medicare HMO | Admitting: Nurse Practitioner

## 2022-02-22 DIAGNOSIS — M5442 Lumbago with sciatica, left side: Secondary | ICD-10-CM | POA: Diagnosis not present

## 2022-02-22 DIAGNOSIS — E1121 Type 2 diabetes mellitus with diabetic nephropathy: Secondary | ICD-10-CM | POA: Diagnosis not present

## 2022-02-22 DIAGNOSIS — R195 Other fecal abnormalities: Secondary | ICD-10-CM | POA: Diagnosis not present

## 2022-02-22 DIAGNOSIS — M25511 Pain in right shoulder: Secondary | ICD-10-CM | POA: Diagnosis not present

## 2022-02-22 DIAGNOSIS — G8929 Other chronic pain: Secondary | ICD-10-CM | POA: Diagnosis not present

## 2022-02-22 DIAGNOSIS — G40909 Epilepsy, unspecified, not intractable, without status epilepticus: Secondary | ICD-10-CM | POA: Diagnosis not present

## 2022-02-22 DIAGNOSIS — I10 Essential (primary) hypertension: Secondary | ICD-10-CM | POA: Diagnosis not present

## 2022-03-08 ENCOUNTER — Ambulatory Visit: Payer: Medicare HMO | Admitting: Gastroenterology

## 2022-03-08 ENCOUNTER — Encounter: Payer: Self-pay | Admitting: Gastroenterology

## 2022-03-08 VITALS — BP 172/92 | HR 76 | Ht 72.0 in | Wt 192.0 lb

## 2022-03-08 DIAGNOSIS — R195 Other fecal abnormalities: Secondary | ICD-10-CM

## 2022-03-08 MED ORDER — NA SULFATE-K SULFATE-MG SULF 17.5-3.13-1.6 GM/177ML PO SOLN: 1.0000 | Freq: Once | ORAL | 0 refills | Status: AC

## 2022-03-08 NOTE — Progress Notes (Signed)
? ? ?History of Present Illness: This is a 81 year old male referred by Anthony Lopes, MD for the evaluation of dark stools, occult blood in stool.  He relates he developed diarrhea and began taking Pepto-Bismol.  He noted dark stools and was evaluated in the urgent care center and then the [facility] ED. FOBT was positive in the ED.  His hemoglobin was normal at 13.4.  The impression was black stools were related to Pepto-Bismol and that he had occult bleeding.  His diarrhea promptly resolved.  After discontinuing Pepto-Bismol his stools have returned to brown.  He has no GI complaints.  He was placed on pantoprazole twice daily in the ED.  Colonoscopy performed in December 2016 showed internal hemorrhoids and was otherwise normal.  He has a remote history of adenomatous colon polyps. Denies weight loss, abdominal pain, constipation, change in stool caliber, hematochezia, nausea, vomiting, dysphagia, reflux symptoms, chest pain. ? ? ? ?Allergies  ?Allergen Reactions  ? Clindamycin/Lincomycin Anaphylaxis  ? Grapefruit Concentrate Swelling  ?  Lip swelling / Groin swelling   ? Ace Inhibitors   ?  REACTION: angioedema  ? Ciprofloxacin Swelling  ? ?Outpatient Medications Prior to Visit  ?Medication Sig Dispense Refill  ? aspirin EC 325 MG tablet Take 325 mg by mouth daily.    ? bimatoprost (LUMIGAN) 0.01 % SOLN Place 1 drop into both eyes at bedtime.     ? Brinzolamide-Brimonidine 1-0.2 % SUSP Place 1 drop into both eyes 2 (two) times daily. [place]    ? cloNIDine (CATAPRES) 0.2 MG tablet Take 0.2 mg by mouth 2 (two) times daily.     ? gabapentin (NEURONTIN) 300 MG capsule Take 300 mg by mouth 2 (two) times daily as needed (neuropathy).     ? metFORMIN (GLUCOPHAGE-XR) 500 MG 24 hr tablet Take 500 mg by mouth daily with supper.    ? metoprolol succinate (TOPROL-XL) 50 MG 24 hr tablet Take 50 mg by mouth at bedtime. Take with or immediately following a meal.     ? pantoprazole (PROTONIX) 40 MG tablet Take 1  tablet (40 mg total) by mouth 2 (two) times daily. 60 tablet 0  ? phenytoin (DILANTIN) 100 MG ER capsule Take 100 mg by mouth 2 (two) times daily.     ? silodosin (RAPAFLO) 4 MG CAPS capsule Take 1 capsule (4 mg total) by mouth daily after supper. (Patient taking differently: Take 4 mg by mouth 2 (two) times a day.) 30 capsule 5  ? triamterene-hydrochlorothiazide (MAXZIDE-25) 37.5-25 MG tablet Take 1 tablet by mouth every morning.    ? verapamil (CALAN-SR) 240 MG CR tablet Take 240 mg by mouth daily.     ? hydrochlorothiazide (HYDRODIURIL) 25 MG tablet Take 25 mg by mouth daily.    ? ?No facility-administered medications prior to visit.  ? ?Past Medical History:  ?Diagnosis Date  ? 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 Arthritis   ? Diabetes mellitus without complication ([place])   ? Glaucoma   ? Hyperlipidemia   ? Hypertension   ? Seizures ([place])   ? Stroke [facility])   ? TIA (transient ischemic attack)   ? 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 ?Past Surgical History:  ?Procedure Laterality Date  ? COLONOSCOPY    ? DENTAL SURGERY    ? implants  ? LEFT EYE SURGERY    ? MENISCUS REPAIR Right   ? POLYPECTOMY    ? PROSTATE BIOPSY    ? RADIOACTIVE SEED IMPLANT N/A  04/27/2019  ? Procedure: RADIOACTIVE SEED IMPLANT/BRACHYTHERAPY IMPLANT;  Surgeon: Franchot Gallo, MD;  Location[facility];  Service: Urology;  Laterality: N/A;  ? SPACE OAR INSTILLATION N/A 04/27/2019  ? Procedure: SPACE OAR INSTILLATION;  Surgeon: Franchot Gallo, MD;  Location: [facility];  Service: Urology;  Laterality: N/A;  ? TONSILLECTOMY    ? VASECTOMY    ? ?Social History  ? ?Socioeconomic History  ? Marital status: Divorced  ?  Spouse name: Not on file  ? Number of children: 4  ? Years of education: Not on file  ? Highest education level: Not on file  ?Occupational History  ? Occupation: retired  ?  Comment: truck driver  ?Tobacco Use  ? Smoking status: Former  ?  Packs/day: 0.00  ?  Years: 0.50  ?  Pack years: 0.00  ?  Types: Cigars, Cigarettes  ?  Quit  date: 09/10/1991  ?  Years since quitting: 30.5  ? Smokeless tobacco: Never  ?Vaping Use  ? Vaping Use: Never used  ?Substance and Sexual Activity  ? Alcohol use: No  ?  Alcohol/week: 0.0 standard drinks  ? Drug use: No  ? Sexual activity: Yes  ?Other Topics Concern  ? Not on file  ?Social History Narrative  ? Not on file  ? ?Social Determinants of Health  ? ?Financial Resource Strain: Not on file  ?Food Insecurity: Not on file  ?Transportation Needs: Not on file  ?Physical Activity: Not on file  ?Stress: Not on file  ?Social Connections: Not on file  ? ?Family History  ?Problem Relation Age of Onset  ? Heart failure Mother   ? Diabetes Mother   ? Heart attack Father   ? Prostate cancer Brother   ? Stroke Granddaughter   ? Colon cancer Neg Hx   ? Stomach cancer Neg Hx   ?   ? ?Review of Systems: Pertinent positive and negative review of systems were noted in the above HPI section. All other review of systems were otherwise negative. ? ? ?Physical Exam: ?General: Well developed, well nourished, no acute distress ?Head: Normocephalic and atraumatic ?Eyes: Sclerae anicteric, EOMI ?Ears: Normal auditory acuity ?Mouth: Not examined, mask on during Covid-19 pandemic ?Neck: Supple, no masses or thyromegaly ?Lungs: Clear throughout to auscultation ?Heart: Regular rate and rhythm; no murmurs, rubs or bruits ?Abdomen: Soft, non tender and non distended. No masses, hepatosplenomegaly or hernias noted. Normal Bowel sounds ?Rectal: Deferred to colonoscopy ?Musculoskeletal: Symmetrical with no gross deformities  ?Skin: No lesions on visible extremities ?Pulses:  Normal pulses noted ?Extremities: No clubbing, cyanosis, edema or deformities noted ?Neurological: Alert oriented x 4, grossly nonfocal ?Cervical Nodes:  No significant cervical adenopathy ?Inguinal Nodes: No significant inguinal adenopathy ?Psychological:  Alert and cooperative. Normal mood and affect ? ? ?Assessment and Recommendations: ? ?Occult blood in stool.  Normal,  stable hemoglobin.  Dark stools due to Pepto-Bismol and he is advised to avoid Pepto-Bismol.  Discontinue pantoprazole as his symptoms do not appear to be due to an upper GI bleed.  Remote history of adenomatous colon polyps however last colonoscopy in 2016 was free of polyps polyp.  Rule out colorectal neoplasms.  Schedule colonoscopy. The risks (including bleeding, perforation, infection, missed lesions, medication reactions and possible hospitalization or surgery if complications occur), benefits, and alternatives to colonoscopy with possible biopsy and possible polypectomy were discussed with the patient and they consent to proceed.   ? ? ? ?cc: Anthony Lopes, MD ?[address] ?[place],  [place] [address] ?

## 2022-03-08 NOTE — Patient Instructions (Signed)
You have been scheduled for a colonoscopy. Please follow written instructions given to you at your visit today.  Please pick up your prep supplies at the pharmacy within the next 1-3 days. If you use inhalers (even only as needed), please bring them with you on the day of your procedure.  The [facility] providers would like to encourage you to use MYCHART to communicate with providers for non-urgent requests or questions.  Due to long hold times on the telephone, sending your provider a message by MYCHART may be a faster and more efficient way to get a response.  Please allow 48 business hours for a response.  Please remember that this is for non-urgent requests.   Due to recent changes in healthcare laws, you may see the results of your imaging and laboratory studies on MyChart before your provider has had a chance to review them.  We understand that in some cases there may be results that are confusing or concerning to you. Not all laboratory results come back in the same time frame and the provider may be waiting for multiple results in order to interpret others.  Please give us 48 hours in order for your provider to thoroughly review all the results before contacting the office for clarification of your results.   Thank you for choosing me and  Gastroenterology.  Malcolm T. Stark, Jr., MD., FACG  

## 2022-03-14 ENCOUNTER — Telehealth: Payer: Self-pay

## 2022-03-14 DIAGNOSIS — Z8546 Personal history of malignant neoplasm of prostate: Secondary | ICD-10-CM | POA: Diagnosis not present

## 2022-03-14 DIAGNOSIS — R310 Gross hematuria: Secondary | ICD-10-CM | POA: Diagnosis not present

## 2022-03-14 MED ORDER — GOLYTELY 236 G PO SOLR: 4000.0000 mL | Freq: Once | ORAL | 0 refills | Status: AC

## 2022-03-14 NOTE — Telephone Encounter (Signed)
Patient walked in stating his suprep was $150 at his pharmacy. Patient is seeking alternate prep. Informed patient I will send Golytely to his pharmacy. Printed new prep instructions and handed them to patient. Called pharmacy and cancelled Suprep prescription. ?

## 2022-04-19 DIAGNOSIS — M21071 Valgus deformity, not elsewhere classified, right ankle: Secondary | ICD-10-CM | POA: Diagnosis not present

## 2022-04-19 DIAGNOSIS — M25871 Other specified joint disorders, right ankle and foot: Secondary | ICD-10-CM | POA: Diagnosis not present

## 2022-04-19 DIAGNOSIS — M792 Neuralgia and neuritis, unspecified: Secondary | ICD-10-CM | POA: Diagnosis not present

## 2022-04-19 DIAGNOSIS — M21072 Valgus deformity, not elsewhere classified, left ankle: Secondary | ICD-10-CM | POA: Diagnosis not present

## 2022-04-19 DIAGNOSIS — M722 Plantar fascial fibromatosis: Secondary | ICD-10-CM | POA: Diagnosis not present

## 2022-04-19 DIAGNOSIS — M25872 Other specified joint disorders, left ankle and foot: Secondary | ICD-10-CM | POA: Diagnosis not present

## 2022-04-19 DIAGNOSIS — I70203 Unspecified atherosclerosis of native arteries of extremities, bilateral legs: Secondary | ICD-10-CM | POA: Diagnosis not present

## 2022-05-02 ENCOUNTER — Ambulatory Visit (AMBULATORY_SURGERY_CENTER): Payer: Medicare HMO | Admitting: Gastroenterology

## 2022-05-02 ENCOUNTER — Encounter: Payer: Self-pay | Admitting: Gastroenterology

## 2022-05-02 VITALS — BP 101/69 | HR 57 | Temp 97.7°F | Resp 11 | Ht 72.0 in | Wt 192.0 lb

## 2022-05-02 DIAGNOSIS — Z1211 Encounter for screening for malignant neoplasm of colon: Secondary | ICD-10-CM

## 2022-05-02 DIAGNOSIS — D122 Benign neoplasm of ascending colon: Secondary | ICD-10-CM | POA: Diagnosis not present

## 2022-05-02 DIAGNOSIS — R195 Other fecal abnormalities: Secondary | ICD-10-CM

## 2022-05-02 DIAGNOSIS — D123 Benign neoplasm of transverse colon: Secondary | ICD-10-CM | POA: Diagnosis not present

## 2022-05-02 MED ORDER — SODIUM CHLORIDE 0.9 % IV SOLN: 500.0000 mL | Freq: Once | INTRAVENOUS | Status: DC

## 2022-05-02 NOTE — Op Note (Signed)
[place] Patient Name: Anthony Walters Procedure Date: 05/02/2022 11:24 AM MRN: [id number] Endoscopist: Ladene Artist , MD Age: 81 Referring MD:  Date of Birth: 14-Aug-1941 Gender: Male Account #: [id number] Procedure:                Colonoscopy Indications:              Heme positive stool Medicines:                Monitored Anesthesia Care Procedure:                Pre-Anesthesia Assessment:                           - Prior to the procedure, a History and Physical                            was performed, and patient medications and                            allergies were reviewed. The patient's tolerance of                            previous anesthesia was also reviewed. The risks                            and benefits of the procedure and the sedation                            options and risks were discussed with the patient.                            All questions were answered, and informed consent                            was obtained. Prior Anticoagulants: The patient has                            taken no previous anticoagulant or antiplatelet                            agents. ASA Grade Assessment: III - A patient with                            severe systemic disease. After reviewing the risks                            and benefits, the patient was deemed in                            satisfactory condition to undergo the procedure.                           After obtaining informed consent, the colonoscope  was passed under direct vision. Throughout the                            procedure, the patient's blood pressure, pulse, and                            oxygen saturations were monitored continuously. The                            CF [id number] #[id number] was introduced through the anus                            and advanced to the the cecum, identified by                            appendiceal orifice and ileocecal valve.  The                            ileocecal valve, appendiceal orifice, and rectum                            were photographed. The quality of the bowel                            preparation was good after substantial lavage,                            suction. The colonoscopy was performed without                            difficulty. The patient tolerated the procedure                            well. Scope In: 11:36:54 AM Scope Out: 11:55:54 AM Scope Withdrawal Time: 0 hours 15 minutes 34 seconds  Total Procedure Duration: 0 hours 19 minutes 0 seconds  Findings:                 The perianal and digital rectal examinations were                            normal.                           Six sessile polyps were found in the transverse                            colon (5) and ascending colon (1). The polyps were                            5 to 8 mm in size. These polyps were removed with a                            cold snare. Resection and retrieval were complete.  Internal hemorrhoids were found during                            retroflexion. The hemorrhoids were moderate and                            Grade I (internal hemorrhoids that do not prolapse).                           The exam was otherwise without abnormality on                            direct and retroflexion views. Complications:            No immediate complications. Estimated blood loss:                            None. Estimated Blood Loss:     Estimated blood loss: none. Impression:               - Six 5 to 8 mm polyps in the transverse colon and                            in the ascending colon, removed with a cold snare.                            Resected and retrieved.                           - Internal hemorrhoids.                           - The examination was otherwise normal on direct                            and retroflexion views. Recommendation:           - Patient has a  contact number available for                            emergencies. The signs and symptoms of potential                            delayed complications were discussed with the                            patient. Return to normal activities tomorrow.                            Written discharge instructions were provided to the                            patient.                           - Resume previous diet.                           -  Continue present medications.                           - Await pathology results.                           - No repeat colonoscopy due to age. Ladene Artist, MD 05/02/2022 11:59:17 AM This report has been signed electronically.

## 2022-05-02 NOTE — Progress Notes (Signed)
History of Present Illness: This is a 81 year old male referred by Donnajean Lopes, MD for the evaluation of dark stools, occult blood in stool.  He relates he developed diarrhea and began taking Pepto-Bismol.  He noted dark stools and was evaluated in the urgent care center and then the [facility] ED. FOBT was positive in the ED.  His hemoglobin was normal at 13.4.  The impression was black stools were related to Pepto-Bismol and that he had occult bleeding.  His diarrhea promptly resolved.  After discontinuing Pepto-Bismol his stools have returned to brown.  He has no GI complaints.  He was placed on pantoprazole twice daily in the ED.  Colonoscopy performed in December 2016 showed internal hemorrhoids and was otherwise normal.  He has a remote history of adenomatous colon polyps. Denies weight loss, abdominal pain, constipation, change in stool caliber, hematochezia, nausea, vomiting, dysphagia, reflux symptoms, chest pain.           Allergies  Allergen Reactions   Clindamycin/Lincomycin Anaphylaxis   Grapefruit Concentrate Swelling      Lip swelling / Groin swelling    Ace Inhibitors        REACTION: angioedema   Ciprofloxacin Swelling          Outpatient Medications Prior to Visit  Medication Sig Dispense Refill   aspirin EC 325 MG tablet Take 325 mg by mouth daily.       bimatoprost (LUMIGAN) 0.01 % SOLN Place 1 drop into both eyes at bedtime.        Brinzolamide-Brimonidine 1-0.2 % SUSP Place 1 drop into both eyes 2 (two) times daily. SIMBRINZA       cloNIDine (CATAPRES) 0.2 MG tablet Take 0.2 mg by mouth 2 (two) times daily.        gabapentin (NEURONTIN) 300 MG capsule Take 300 mg by mouth 2 (two) times daily as needed (neuropathy).        metFORMIN (GLUCOPHAGE-XR) 500 MG 24 hr tablet Take 500 mg by mouth daily with supper.       metoprolol succinate (TOPROL-XL) 50 MG 24 hr tablet Take 50 mg by mouth at bedtime. Take with or immediately following a meal.        pantoprazole  (PROTONIX) 40 MG tablet Take 1 tablet (40 mg total) by mouth 2 (two) times daily. 60 tablet 0   phenytoin (DILANTIN) 100 MG ER capsule Take 100 mg by mouth 2 (two) times daily.        silodosin (RAPAFLO) 4 MG CAPS capsule Take 1 capsule (4 mg total) by mouth daily after supper. (Patient taking differently: Take 4 mg by mouth 2 (two) times a day.) 30 capsule 5   triamterene-hydrochlorothiazide (MAXZIDE-25) 37.5-25 MG tablet Take 1 tablet by mouth every morning.       verapamil (CALAN-SR) 240 MG CR tablet Take 240 mg by mouth daily.        hydrochlorothiazide (HYDRODIURIL) 25 MG tablet Take 25 mg by mouth daily.        No facility-administered medications prior to visit.        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  Past Surgical History:  Procedure Laterality Date   COLONOSCOPY       DENTAL SURGERY        implants   LEFT EYE SURGERY       MENISCUS REPAIR Right     POLYPECTOMY       PROSTATE BIOPSY       RADIOACTIVE SEED IMPLANT N/A 04/27/2019    Procedure: RADIOACTIVE SEED IMPLANT/BRACHYTHERAPY IMPLANT;  Surgeon: Franchot Gallo, MD;  Location: [facility] ORS;  Service: Urology;  Laterality: N/A;   SPACE OAR INSTILLATION N/A 04/27/2019    Procedure: SPACE OAR INSTILLATION;  Surgeon: Franchot Gallo, MD;  Location: [facility];  Service: Urology;  Laterality: N/A;   TONSILLECTOMY       VASECTOMY        Social History         Socioeconomic History   Marital status: Divorced      Spouse name: Not on file   Number of children: 4   Years of education: Not on file   Highest education level: Not on file  Occupational History   Occupation: retired      Comment: truck driver  Tobacco Use   Smoking status: Former       Packs/day: 0.00      Years: 0.50      Pack years: 0.00      Types: Cigars, Cigarettes      Quit date: 09/10/1991      Years since quitting: 30.5   Smokeless tobacco: Never  Vaping Use   Vaping Use: Never used  Substance and Sexual Activity   Alcohol use: No      Alcohol/week: 0.0 standard drinks   Drug use: No   Sexual activity: Yes  Other Topics Concern   Not on file  Social History Narrative   Not on file    Social Determinants of Health    Financial Resource Strain: Not on file  Food Insecurity: Not on file  Transportation Needs: Not on file  Physical Activity: Not on file  Stress: Not on file  Social Connections: Not on file         Family History  Problem Relation Age of Onset   Heart failure Mother     Diabetes Mother     Heart attack Father     Prostate cancer Brother     Stroke Granddaughter     Colon cancer Neg Hx     Stomach cancer Neg Hx         Review of Systems: Pertinent positive and negative review of systems were noted in the above HPI section. All other review of systems were otherwise negative.     Physical Exam: General: Well developed, well nourished, no acute distress Head: Normocephalic and atraumatic Eyes: Sclerae anicteric, EOMI Ears: Normal auditory acuity Mouth: Not examined, mask on during Covid-19 pandemic Neck: Supple, no masses or thyromegaly Lungs: Clear throughout to auscultation Heart: Regular rate and rhythm; no murmurs, rubs or bruits Abdomen: Soft, non tender and non distended. No masses, hepatosplenomegaly or hernias noted. Normal Bowel sounds Rectal: Deferred to colonoscopy Musculoskeletal: Symmetrical with no gross deformities  Skin: No lesions on visible extremities Pulses:  Normal pulses noted Extremities: No clubbing, cyanosis, edema or deformities noted Neurological: Alert oriented x 4, grossly nonfocal Cervical Nodes:  No significant cervical adenopathy Inguinal Nodes: No significant inguinal  adenopathy Psychological:  Alert and cooperative. Normal mood and affect     Assessment and Recommendations:   Occult blood in stool for  colonoscopy.

## 2022-05-02 NOTE — Progress Notes (Signed)
PT taken to PACU. Monitors in place. VSS. Report given to RN. 

## 2022-05-02 NOTE — Progress Notes (Signed)
Called to room to assist during endoscopic procedure.  Patient ID and intended procedure confirmed with present staff. Received instructions for my participation in the procedure from the performing physician.  

## 2022-05-02 NOTE — Patient Instructions (Signed)
Handouts Provided:  Polyps  YOU HAD AN ENDOSCOPIC PROCEDURE TODAY AT THE [facility]:   Refer to the procedure report that was given to you for any specific questions about what was found during the examination.  If the procedure report does not answer your questions, please call your gastroenterologist to clarify.  If you requested that your care partner not be given the details of your procedure findings, then the procedure report has been included in a sealed envelope for you to review at your convenience later.  YOU SHOULD EXPECT: Some feelings of bloating in the abdomen. Passage of more gas than usual.  Walking can help get rid of the air that was put into your GI tract during the procedure and reduce the bloating. If you had a lower endoscopy (such as a colonoscopy or flexible sigmoidoscopy) you may notice spotting of blood in your stool or on the toilet paper. If you underwent a bowel prep for your procedure, you may not have a normal bowel movement for a few days.  Please Note:  You might notice some irritation and congestion in your nose or some drainage.  This is from the oxygen used during your procedure.  There is no need for concern and it should clear up in a day or so.  SYMPTOMS TO REPORT IMMEDIATELY:   Following lower endoscopy (colonoscopy or flexible sigmoidoscopy):  Excessive amounts of blood in the stool  Significant tenderness or worsening of abdominal pains  Swelling of the abdomen that is new, acute  Fever of 100F or higher  For urgent or emergent issues, a gastroenterologist can be reached at any hour by calling [phone]. Do not use MyChart messaging for urgent concerns.    DIET:  We do recommend a small meal at first, but then you may proceed to your regular diet.  Drink plenty of fluids but you should avoid alcoholic beverages for 24 hours.  ACTIVITY:  You should plan to take it easy for the rest of today and you should NOT DRIVE or use heavy  machinery until tomorrow (because of the sedation medicines used during the test).    FOLLOW UP: Our staff will call the number listed on your records 48-72 hours following your procedure to check on you and address any questions or concerns that you may have regarding the information given to you following your procedure. If we do not reach you, we will leave a message.  We will attempt to reach you two times.  During this call, we will ask if you have developed any symptoms of COVID 19. If you develop any symptoms (ie: fever, flu-like symptoms, shortness of breath, cough etc.) before then, please call [phone].  If you test positive for Covid 19 in the 2 weeks post procedure, please call and report this information to us.    If any biopsies were taken you will be contacted by phone or by letter within the next 1-3 weeks.  Please call us at [phone] if you have not heard about the biopsies in 3 weeks.    SIGNATURES/CONFIDENTIALITY: You and/or your care partner have signed paperwork which will be entered into your electronic medical record.  These signatures attest to the fact that that the information above on your After Visit Summary has been reviewed and is understood.  Full responsibility of the confidentiality of this discharge information lies with you and/or your care-partner.  

## 2022-05-03 ENCOUNTER — Telehealth: Payer: Self-pay

## 2022-05-03 NOTE — Telephone Encounter (Signed)
  Follow up Call-     05/02/2022   10:43 AM  Call back number  Post procedure Call Back phone  # [phone]  Permission to leave phone message Yes     Patient questions:  Do you have a fever, pain , or abdominal swelling? No. Pain Score  0 *  Have you tolerated food without any problems? Yes.    Have you been able to return to your normal activities? Yes.    Do you have any questions about your discharge instructions: Diet   No. Medications  Yes, patient wants to know if he can resume his 325 mg of aspirin. I advised him to contact his primary MD since we were not the one who had him hold it. He understood and said he would call them. Follow up visit  No.  Do you have questions or concerns about your Care? Yes.  Patient was concerned that he was still having diarrhea after his colonoscopy. 2 episodes since his procedure yesterday. I explained that this can happen and that he should start to see an improvement with his BMs. I also asked if he was eating and he said he was eating fine and drinking fluids as well. I advised that it may take a few days for his BMs to return to normal but that he should definitely see an improvement with the diarrhea. He understood and will call his primary for a recommendation on his asa usage.    Actions: * If pain score is 4 or above: No action needed, pain <4.

## 2022-05-03 NOTE — Telephone Encounter (Signed)
  Follow up Call-     05/02/2022   10:43 AM  Call back number  Post procedure Call Back phone  # [phone]  Permission to leave phone message Yes    Post op call attempted, no answer, left WM.

## 2022-05-03 NOTE — Telephone Encounter (Signed)
Patient returned your call requesting a call back to discuss issues he is having. Please advise.

## 2022-05-04 DIAGNOSIS — M21072 Valgus deformity, not elsewhere classified, left ankle: Secondary | ICD-10-CM | POA: Diagnosis not present

## 2022-05-04 DIAGNOSIS — M25871 Other specified joint disorders, right ankle and foot: Secondary | ICD-10-CM | POA: Diagnosis not present

## 2022-05-04 DIAGNOSIS — M722 Plantar fascial fibromatosis: Secondary | ICD-10-CM | POA: Diagnosis not present

## 2022-05-04 DIAGNOSIS — M25872 Other specified joint disorders, left ankle and foot: Secondary | ICD-10-CM | POA: Diagnosis not present

## 2022-05-04 DIAGNOSIS — M21071 Valgus deformity, not elsewhere classified, right ankle: Secondary | ICD-10-CM | POA: Diagnosis not present

## 2022-05-14 ENCOUNTER — Encounter: Payer: Self-pay | Admitting: Gastroenterology

## 2022-06-01 DIAGNOSIS — M25872 Other specified joint disorders, left ankle and foot: Secondary | ICD-10-CM | POA: Diagnosis not present

## 2022-06-01 DIAGNOSIS — M24572 Contracture, left ankle: Secondary | ICD-10-CM | POA: Diagnosis not present

## 2022-06-01 DIAGNOSIS — M722 Plantar fascial fibromatosis: Secondary | ICD-10-CM | POA: Diagnosis not present

## 2022-06-06 DIAGNOSIS — H401132 Primary open-angle glaucoma, bilateral, moderate stage: Secondary | ICD-10-CM | POA: Diagnosis not present

## 2022-06-13 DIAGNOSIS — C61 Malignant neoplasm of prostate: Secondary | ICD-10-CM | POA: Diagnosis not present

## 2022-06-14 DIAGNOSIS — Z8546 Personal history of malignant neoplasm of prostate: Secondary | ICD-10-CM | POA: Diagnosis not present

## 2022-06-14 DIAGNOSIS — E1129 Type 2 diabetes mellitus with other diabetic kidney complication: Secondary | ICD-10-CM | POA: Diagnosis not present

## 2022-06-14 DIAGNOSIS — E785 Hyperlipidemia, unspecified: Secondary | ICD-10-CM | POA: Diagnosis not present

## 2022-06-14 DIAGNOSIS — I10 Essential (primary) hypertension: Secondary | ICD-10-CM | POA: Diagnosis not present

## 2022-06-14 DIAGNOSIS — N5201 Erectile dysfunction due to arterial insufficiency: Secondary | ICD-10-CM | POA: Diagnosis not present

## 2022-06-18 DIAGNOSIS — Z8546 Personal history of malignant neoplasm of prostate: Secondary | ICD-10-CM | POA: Diagnosis not present

## 2022-06-18 DIAGNOSIS — R310 Gross hematuria: Secondary | ICD-10-CM | POA: Diagnosis not present

## 2022-06-18 DIAGNOSIS — C61 Malignant neoplasm of prostate: Secondary | ICD-10-CM | POA: Diagnosis not present

## 2022-06-18 DIAGNOSIS — N5201 Erectile dysfunction due to arterial insufficiency: Secondary | ICD-10-CM | POA: Diagnosis not present

## 2022-06-27 DIAGNOSIS — M2042 Other hammer toe(s) (acquired), left foot: Secondary | ICD-10-CM | POA: Diagnosis not present

## 2022-06-27 DIAGNOSIS — M24572 Contracture, left ankle: Secondary | ICD-10-CM | POA: Diagnosis not present

## 2022-10-08 DIAGNOSIS — H401132 Primary open-angle glaucoma, bilateral, moderate stage: Secondary | ICD-10-CM | POA: Diagnosis not present

## 2022-10-08 DIAGNOSIS — H25813 Combined forms of age-related cataract, bilateral: Secondary | ICD-10-CM | POA: Diagnosis not present

## 2022-11-07 DIAGNOSIS — M24572 Contracture, left ankle: Secondary | ICD-10-CM | POA: Diagnosis not present

## 2022-11-07 DIAGNOSIS — M25872 Other specified joint disorders, left ankle and foot: Secondary | ICD-10-CM | POA: Diagnosis not present

## 2022-11-13 DIAGNOSIS — E785 Hyperlipidemia, unspecified: Secondary | ICD-10-CM | POA: Diagnosis not present

## 2022-11-13 DIAGNOSIS — R7989 Other specified abnormal findings of blood chemistry: Secondary | ICD-10-CM | POA: Diagnosis not present

## 2022-11-13 DIAGNOSIS — E1121 Type 2 diabetes mellitus with diabetic nephropathy: Secondary | ICD-10-CM | POA: Diagnosis not present

## 2022-11-13 DIAGNOSIS — Z125 Encounter for screening for malignant neoplasm of prostate: Secondary | ICD-10-CM | POA: Diagnosis not present

## 2022-11-13 DIAGNOSIS — I10 Essential (primary) hypertension: Secondary | ICD-10-CM | POA: Diagnosis not present

## 2022-11-20 DIAGNOSIS — E785 Hyperlipidemia, unspecified: Secondary | ICD-10-CM | POA: Diagnosis not present

## 2022-11-20 DIAGNOSIS — N5201 Erectile dysfunction due to arterial insufficiency: Secondary | ICD-10-CM | POA: Diagnosis not present

## 2022-11-20 DIAGNOSIS — G40909 Epilepsy, unspecified, not intractable, without status epilepticus: Secondary | ICD-10-CM | POA: Diagnosis not present

## 2022-11-20 DIAGNOSIS — Z8673 Personal history of transient ischemic attack (TIA), and cerebral infarction without residual deficits: Secondary | ICD-10-CM | POA: Diagnosis not present

## 2022-11-20 DIAGNOSIS — Z1339 Encounter for screening examination for other mental health and behavioral disorders: Secondary | ICD-10-CM | POA: Diagnosis not present

## 2022-11-20 DIAGNOSIS — Z0000 Encounter for general adult medical examination without abnormal findings: Secondary | ICD-10-CM | POA: Diagnosis not present

## 2022-11-20 DIAGNOSIS — I10 Essential (primary) hypertension: Secondary | ICD-10-CM | POA: Diagnosis not present

## 2022-11-20 DIAGNOSIS — F419 Anxiety disorder, unspecified: Secondary | ICD-10-CM | POA: Diagnosis not present

## 2022-11-20 DIAGNOSIS — Z8546 Personal history of malignant neoplasm of prostate: Secondary | ICD-10-CM | POA: Diagnosis not present

## 2022-11-20 DIAGNOSIS — E1129 Type 2 diabetes mellitus with other diabetic kidney complication: Secondary | ICD-10-CM | POA: Diagnosis not present

## 2022-11-20 DIAGNOSIS — R82998 Other abnormal findings in urine: Secondary | ICD-10-CM | POA: Diagnosis not present

## 2022-11-20 DIAGNOSIS — M5442 Lumbago with sciatica, left side: Secondary | ICD-10-CM | POA: Diagnosis not present

## 2022-11-20 DIAGNOSIS — Z1331 Encounter for screening for depression: Secondary | ICD-10-CM | POA: Diagnosis not present

## 2022-12-14 DIAGNOSIS — M25562 Pain in left knee: Secondary | ICD-10-CM | POA: Diagnosis not present

## 2022-12-14 DIAGNOSIS — M1712 Unilateral primary osteoarthritis, left knee: Secondary | ICD-10-CM | POA: Diagnosis not present

## 2023-02-05 DIAGNOSIS — I10 Essential (primary) hypertension: Secondary | ICD-10-CM | POA: Diagnosis not present

## 2023-02-05 DIAGNOSIS — E1121 Type 2 diabetes mellitus with diabetic nephropathy: Secondary | ICD-10-CM | POA: Diagnosis not present

## 2023-02-05 DIAGNOSIS — M7989 Other specified soft tissue disorders: Secondary | ICD-10-CM | POA: Diagnosis not present

## 2023-02-05 DIAGNOSIS — Z8546 Personal history of malignant neoplasm of prostate: Secondary | ICD-10-CM | POA: Diagnosis not present

## 2023-02-07 ENCOUNTER — Other Ambulatory Visit (HOSPITAL_COMMUNITY): Payer: Self-pay | Admitting: Internal Medicine

## 2023-02-07 ENCOUNTER — Other Ambulatory Visit (HOSPITAL_COMMUNITY): Payer: Self-pay

## 2023-02-07 ENCOUNTER — Encounter (HOSPITAL_COMMUNITY): Payer: Self-pay

## 2023-02-07 ENCOUNTER — Ambulatory Visit (HOSPITAL_COMMUNITY)
Admission: RE | Admit: 2023-02-07 | Discharge: 2023-02-07 | Disposition: A | Discharge status: Home / Self Care | Payer: Medicare HMO | Source: Ambulatory Visit | Attending: Surgery | Admitting: Surgery

## 2023-02-07 ENCOUNTER — Ambulatory Visit (HOSPITAL_BASED_OUTPATIENT_CLINIC_OR_DEPARTMENT_OTHER)
Admission: RE | Admit: 2023-02-07 | Discharge: 2023-02-07 | Disposition: A | Discharge status: Home / Self Care | Payer: Medicare HMO | Source: Ambulatory Visit | Attending: Vascular Surgery | Admitting: Vascular Surgery

## 2023-02-07 VITALS — BP 144/86 | HR 65 | Wt 193.8 lb

## 2023-02-07 DIAGNOSIS — M7989 Other specified soft tissue disorders: Secondary | ICD-10-CM | POA: Diagnosis not present

## 2023-02-07 DIAGNOSIS — I82431 Acute embolism and thrombosis of right popliteal vein: Secondary | ICD-10-CM | POA: Diagnosis not present

## 2023-02-07 DIAGNOSIS — Z86718 Personal history of other venous thrombosis and embolism: Secondary | ICD-10-CM | POA: Insufficient documentation

## 2023-02-07 LAB — COMPREHENSIVE METABOLIC PANEL
ALT: 21 U/L (ref 0–44)
AST: 18 U/L (ref 15–41)
Albumin: 3.5 g/dL (ref 3.5–5.0)
Alkaline Phosphatase: 136 U/L — ABNORMAL HIGH (ref 38–126)
Anion gap: 10 (ref 5–15)
BUN: 6 mg/dL — ABNORMAL LOW (ref 8–23)
CO2: 27 mmol/L (ref 22–32)
Calcium: 9.4 mg/dL (ref 8.9–10.3)
Chloride: 100 mmol/L (ref 98–111)
Creatinine, Ser: 0.86 mg/dL (ref 0.61–1.24)
GFR, Estimated: >60 mL/min (ref >60)
Glucose, Bld: 108 mg/dL — ABNORMAL HIGH (ref 70–99)
Potassium: 4 mmol/L (ref 3.5–5.1)
Sodium: 137 mmol/L (ref 135–145)
Total Bilirubin: 0.4 mg/dL (ref 0.3–1.2)
Total Protein: 7.2 g/dL (ref 6.5–8.1)

## 2023-02-07 LAB — CBC
HCT: 42 % (ref 39.0–52.0)
Hemoglobin: 13.5 g/dL (ref 13.0–17.0)
MCH: 29.4 pg (ref 26.0–34.0)
MCHC: 32.1 g/dL (ref 30.0–36.0)
MCV: 91.5 fL (ref 80.0–100.0)
Platelets: 221 10*3/uL (ref 150–400)
RBC: 4.59 MIL/uL (ref 4.22–5.81)
RDW: 13.2 % (ref 11.5–15.5)
WBC: 6 10*3/uL (ref 4.0–10.5)
nRBC: 0 % (ref 0.0–0.2)

## 2023-02-07 MED ORDER — ENOXAPARIN SODIUM 80 MG/0.8ML IJ SOSY
80.0000 mg | PREFILLED_SYRINGE | Freq: Two times a day (BID) | INTRAMUSCULAR | 0 refills | Status: DC
  Filled 2023-02-07 (×2): qty 48, 30d supply, fill #0

## 2023-02-07 MED ORDER — WARFARIN SODIUM 5 MG PO TABS
5.0000 mg | ORAL_TABLET | Freq: Every day | ORAL | 0 refills | Status: AC
  Filled 2023-02-07: qty 30, 30d supply, fill #0

## 2023-02-07 NOTE — Progress Notes (Signed)
DVT Clinic Note  Name: Anthony Carufel Sr.     MRN: [id number]     DOB: 21-Jul-1941     Sex: male  PCP: Donnajean Lopes, MD  Today's Visit: Visit Information: Initial Visit  Referred to DVT Clinic by: Dr. Philip Aspen (PCP - [place])  Referred to CPP by: Dr. Stanford Breed Reason for referral:  Chief Complaint  Patient presents with   DVT   HISTORY OF PRESENT ILLNESS:  Anthony Prevost Sr. is a 82 y.o. male with PMH HTN, prediabetes, HLD, seizure disorder on phenytoin, TIA 25 years ago now on aspirin, hx prostate cancer (clinically stable without recurrence), who presents after diagnosis of DVT for medication management. Patient reports he started having pain and swelling in both legs about 5 days ago with right worse than left. He saw his PCP today who ordered dopplers to rule out DVT, which found DVT involving R popliteal, peroneal, and posterior tibial veins and SVT involving R small saphenous vein. He stays very active weightlifting and stretching. Reports his daughter once had a DVT after traveling from [place] to [place]. Otherwise, no family history of VTE. He is wearing compression stockings today and reports he elevates his legs daily. Of note, patient does have a history of hematuria in 2023 which the patient says was related to an infection, and his urologist temporarily stopped aspirin, which has since been restarted. He also had an ED visit 02/13/22 for blood in stool, which was thought to be related to Pepto-Bismol use. The patient had a colonoscopy following this, where 6 polyps were removed. No GI bleeding seen. He has had no blood in urine or stool since restarting aspirin.   Positive Thrombotic Risk Factors: Older Age Bleeding Risk Factors: Age >65 years, Antiplatelet therapy  Negative Thrombotic Risk Factors: Previous VTE, Recent surgery (within 3 months), Recent trauma (within 3 months), Recent admission to hospital with acute illness (within 3 months), Central venous  catheterization, Pregnancy, Paralysis, paresis, or recent plaster cast immobilization of lower extremity, Bed rest >72 hours within 3 months, Estrogen therapy, Testosterone therapy, Smoking, Obesity, Active cancer, Known thrombophilic condition, Sedentary journey lasting >8 hours within 4 weeks, Recent cesarean section (within 3 months), Within 6 weeks postpartum, Erythropoiesis-stimulating agent, Non-malignant, chronic inflammatory condition, Recent COVID diagnosis (within 3 months)  Rx Insurance Coverage: Medicare Rx Affordability: Warfarin is $0. Lovenox is $95 for any day supply ($95 for a 10 day supply or a 30 day supply). Wrote prescription as a 30 day supply. He will likely not need it for 30 days, but he can keep this at home in the event he needs to use it again in the future for bridging.  Preferred Pharmacy: Filled prescriptions today at [facility]. Future fills to go to patient's preferred [facility].   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    Past Surgical History:  Procedure Laterality Date   COLONOSCOPY     DENTAL SURGERY     implants   LEFT EYE SURGERY     MENISCUS REPAIR Right    POLYPECTOMY     PROSTATE BIOPSY     RADIOACTIVE SEED IMPLANT N/A 04/27/2019   Procedure:  RADIOACTIVE SEED IMPLANT/BRACHYTHERAPY IMPLANT;  Surgeon: Franchot Gallo, MD;  Location: [facility];  Service: Urology;  Laterality: N/A;   SPACE OAR INSTILLATION N/A 04/27/2019   Procedure: SPACE OAR INSTILLATION;  Surgeon: Franchot Gallo, MD;  Location: [facility];  Service: Urology;  Laterality: N/A;   TONSILLECTOMY     VASECTOMY      Social History   Socioeconomic History   Marital status: Divorced    Spouse name: Not on  file   Number of children: 4   Years of education: Not on file   Highest education level: Not on file  Occupational History   Occupation: retired    Comment: truck driver  Tobacco Use   Smoking status: Former    Packs/day: 0.00    Years: 0.50    Total pack years: 0.00    Types: Cigars, Cigarettes    Quit date: 09/10/1991    Years since quitting: 31.4   Smokeless tobacco: Never  Vaping Use   Vaping Use: Never used  Substance and Sexual Activity   Alcohol use: No    Alcohol/week: 0.0 standard drinks of alcohol   Drug use: No   Sexual activity: Yes  Other Topics Concern   Not on file  Social History Narrative   Not on file   Social Determinants of Health   Financial Resource Strain: Not on file  Food Insecurity: Not on file  Transportation Needs: Not on file  Physical Activity: Not on file  Stress: Not on file  Social Connections: Not on file  Intimate Partner Violence: Not on file    Family History  Problem Relation Age of Onset   Heart failure Mother    Diabetes Mother    Heart attack Father    Prostate cancer Brother    Stroke Granddaughter    Colon cancer Neg Hx    Stomach cancer Neg Hx    Esophageal cancer Neg Hx    Rectal cancer Neg Hx     Allergies as of 02/07/2023 - Review Complete 02/07/2023  Allergen Reaction Noted   Clindamycin/lincomycin Anaphylaxis 08/06/2014   Grapefruit concentrate Swelling 10/10/2015   Ace inhibitors  07/03/2007   Ciprofloxacin Swelling 07/03/2007    Current Outpatient Medications on File Prior to Encounter  Medication Sig Dispense Refill   aspirin EC 325 MG tablet Take 325 mg by mouth daily.     bimatoprost (LUMIGAN) 0.01 % SOLN Place 1 drop into both eyes at bedtime.      cloNIDine (CATAPRES) 0.2 MG tablet Take 0.2 mg by mouth 3 (three) times daily.     dorzolamide-timolol (COSOPT) 22.3-6.8 MG/ML ophthalmic solution INSTILL 1 DROP INTO EACH EYE TWICE DAILY Ophthalmic for 38 Days     gabapentin (NEURONTIN) 300 MG capsule  Take 300 mg by mouth 2 (two) times daily as needed (neuropathy).      metFORMIN (GLUCOPHAGE-XR) 500 MG 24 hr tablet Take 500 mg by mouth daily with supper.     metoprolol succinate (TOPROL-XL) 50 MG 24 hr tablet Take 1 tablet by mouth 2 (two) times daily.     phenytoin (DILANTIN) 100 MG ER capsule Take 100 mg by mouth 2 (two) times daily.      rosuvastatin (CRESTOR) 5 MG tablet Take 5 mg by mouth daily.     tamsulosin (FLOMAX) 0.4 MG CAPS capsule Take 0.4 mg by mouth 2 (two) times daily.     triamterene-hydrochlorothiazide (MAXZIDE-25) 37.5-25 MG tablet Take 1 tablet by mouth every morning.     verapamil (CALAN-SR)  240 MG CR tablet Take 240 mg by mouth daily.      No current facility-administered medications on file prior to encounter.   REVIEW OF SYSTEMS:  Review of Systems  Respiratory:  Negative for shortness of breath.   Cardiovascular:  Positive for leg swelling. Negative for chest pain and palpitations.  Musculoskeletal:  Positive for myalgias.  Neurological:  Positive for tingling. Negative for dizziness.   PHYSICAL EXAMINATION:  Vitals:   02/07/23 1549  BP: (!) 144/86  Pulse: 65  SpO2: 100%  Weight: 193 lb 12.8 oz (87.9 kg)    Body mass index is 26.28 kg/m.  Physical Exam Vitals reviewed.  Cardiovascular:     Rate and Rhythm: Normal rate.  Pulmonary:     Effort: Pulmonary effort is normal.  Musculoskeletal:     Right lower leg: Edema (2+) present.     Left lower leg: Edema (1+) present.  Psychiatric:        Mood and Affect: Mood normal.        Behavior: Behavior normal.        Thought Content: Thought content normal.   Villalta Score for Post-Thrombotic Syndrome: Pain: Moderate Cramps: Moderate Heaviness: Moderate Paresthesia: Moderate Pruritus: Mild Pretibial Edema: Moderate Skin Induration: Absent Hyperpigmentation: Absent Redness: Absent Venous Ectasia: Absent Pain on calf compression: Mild Villalta Preliminary Score: 12 Is venous ulcer present?:  No If venous ulcer is present and score is <15, then 15 points total are assigned: Absent Villalta Total Score: 12  LABS:  CBC     Component Value Date/Time   WBC 6.0 02/07/2023 1621   RBC 4.59 02/07/2023 1621   HGB 13.5 02/07/2023 1621   HCT 42.0 02/07/2023 1621   PLT 221 02/07/2023 1621   MCV 91.5 02/07/2023 1621   MCH 29.4 02/07/2023 1621   MCHC 32.1 02/07/2023 1621   RDW 13.2 02/07/2023 1621   LYMPHSABS 2.3 01/10/2016 1921   MONOABS 0.6 01/10/2016 1921   EOSABS 0.1 01/10/2016 1921   BASOSABS 0.0 01/10/2016 1921    Hepatic Function      Component Value Date/Time   PROT 7.2 02/07/2023 1621   ALBUMIN 3.5 02/07/2023 1621   AST 18 02/07/2023 1621   ALT 21 02/07/2023 1621   ALKPHOS 136 (H) 02/07/2023 1621   BILITOT 0.4 02/07/2023 1621   BILIDIR <0.1 05/31/2013 1707   IBILI NOT CALCULATED 05/31/2013 1707    Renal Function   Lab Results  Component Value Date   CREATININE 0.86 02/07/2023   CREATININE 0.72 02/13/2022   CREATININE 0.82 04/24/2019    CrCl cannot be calculated (Unknown ideal weight.).   [facility] Vascular Lab Studies:  02/07/23 VAS [place] LOWER EXTREMITY VENOUS (DVT) RIGHT  Summary:  RIGHT:  - Findings consistent with acute and occlusive deep vein thrombosis  involving the proximal popliteal vein and peroneal veins.   - Partially occlusive and age-indeterminate thrombosis involving the mid  and distal popliteal vein, tibioperoneal trunk, 1 paired posterior tibial  vein.  - Findings consistent with acute superficial vein thrombosis involving the  right small saphenous vein.   ASSESSMENT: Location of DVT: Right femoral vein, Right popliteal vein, Right distal vein, Right superficial vein Cause of DVT: unprovoked   DOACs are contraindicated due to the patient taking phenytoin. He must therefore start warfarin bridged with Lovenox. Renal function is appropriate for q12h dosing of Lovenox. Checked baseline CBC today as well, which was WNL. Phenytoin can increase or  decrease INR, so will start with standard warfarin initiation dosing  and will adjust based on INR. Plan to continue Lovenox bridge until INR >2 (INR goal 2-3 for VTE). Recommend checking INR after 5 doses of warfarin (6 days from now - next Wednesday 3/6).   Warfarin can also increase phenytoin concentrations. I will reach out to his PCP to inform him of warfarin start and defer further phenytoin monitoring to him. I will also reach out to his PCP's office to see if they can monitor his INRs going forward, as the patient prefers to have INR monitoring done there.   Given patient's history of hematuria and tarry stool in the past while taking aspirin and Pepto-Bismol, I will have him stop taking aspirin while we start anticoagulation until I can confer with his PCP on his recommendation for this.   PLAN: -Start warfarin (Coumadin) 5 mg daily enoxaparin (Lovenox) 1 mg/kg (80 mg) every 12 hours. -Expected duration of therapy: Given DVT was unprovoked, will defer decision on duration to hematology. Therapy started on 02/07/23. -Patient educated on purpose, proper use and potential adverse effects of warfarin (Coumadin) enoxaparin (Lovenox). -Counseled on dietary and drug interactions of warfarin. -Discussed importance of taking medication around the same time every day. -Advised patient of medications to avoid (NSAIDs, aspirin doses >100 mg daily). -Educated that Tylenol (acetaminophen) is the preferred analgesic to lower the risk of bleeding. -Advised patient to alert all providers of anticoagulation therapy prior to starting a new medication or having a procedure. -Emphasized importance of monitoring for signs and symptoms of bleeding (abnormal bruising, prolonged bleeding, nose bleeds, bleeding from gums, discolored urine, black tarry stools). -Educated patient to present to the ED if emergent signs and symptoms of new thrombosis occur. -Counseled patient to wear compression stockings daily,  removing at night. -Referral to hematology placed for further workup of unprovoked DVT.  -Extensively counseled the patient how to administer Lovenox including showing him a video of how to administer. He received Lovenox and warfarin from the [facility] today and elected to self-administer his first doses during the visit.  -All of the patient's questions were answered at this time.   Follow up: Pending discussion with patient's PCP as detailed above, will reach out to the patient to schedule next visit in DVT Clinic.   Rebbeca Paul, PharmD, [place], CPP [facility]rombosis [facility] Clinical Pharmacist Practitioner Office: [phone]

## 2023-02-07 NOTE — Patient Instructions (Addendum)
-Start Lovenox 80 mg (1 syringe) every 12 hours.  -Start Coumadin (warfarin) 5 mg (1 tablet) daily. We will plan your next INR check either with  me or at Dr. Shon Baton office to determine when you can stop the Lovenox and if your Coumadin dose will need to be changed.  -Stop taking aspirin until you hear otherwise.  -It is important to take your medication around the same time every day.  -Avoid NSAIDs like ibuprofen (Advil, Motrin) and naproxen (Aleve) as well as aspirin doses over 100 mg daily. -Tylenol (acetaminophen) is the preferred over the counter pain medication to lower the risk of bleeding. -Be sure to alert all of your health care providers that you are taking an anticoagulant prior to starting a new medication or having a procedure. -Monitor for signs and symptoms of bleeding (abnormal bruising, prolonged bleeding, nose bleeds, bleeding from gums, discolored urine, black tarry stools). If you have fallen and hit your head OR if your bleeding is severe or not stopping, seek emergency care.  -Go to the emergency room if emergent signs and symptoms of new clot occur (new or worse swelling and pain in an arm or leg, shortness of breath, chest pain, fast or irregular heartbeats, lightheadedness, dizziness, fainting, coughing up blood) or if you experience a significant color change (pale or blue) in the extremity that has the DVT.  -We recommend you wear compression stockings as long as you are having swelling or pain. Be sure to purchase the correct size and take them off at night.   I will call you tomorrow to plan your next appointment.   [facility] [place], [place], [place] [address] Enter the hospital through [address] off [address] and pull up to the [address] entrance to the free [place] parking.  Check in for your appointment at the [facility].   I am referring you to a hematologist.  If you have any questions or  need to reschedule an appointment, please call [phone] [facility].  If you are having an emergency, call 911 or present to the nearest emergency room.   What is a DVT?  -Deep vein thrombosis (DVT) is a condition in which a blood clot forms in a vein of the deep venous system which can occur in the lower leg, thigh, pelvis, arm, or neck. This condition is serious and can be life-threatening if the clot travels to the arteries of the lungs and causing a blockage (pulmonary embolism, PE). A DVT can also damage veins in the leg, which can lead to long-term venous disease, leg pain, swelling, discoloration, and ulcers or sores (post-thrombotic syndrome).  -Treatment may include taking an anticoagulant medication to prevent more clots from forming and the current clot from growing, wearing compression stockings, and/or surgical procedures to remove or dissolve the clot.   Enoxaparin Injection What is this medication? ENOXAPARIN (ee nox a PA rin) prevents or treats blood clots. It belongs to a group of medications called blood thinners. This medicine may be used for other purposes; ask your health care provider or pharmacist if you have questions. COMMON BRAND NAME(S): Lovenox What should I tell my care team before I take this medication? They need to know if you have any of these conditions: Bleeding disorder, hemorrhage, or hemophilia Infection of the heart or heart valves Kidney disease Liver disease Previous stroke Prosthetic heart valve Recent surgery Recent delivery of a baby Stomach ulcers,  other stomach or intestine problems An unusual or allergic reaction to enoxaparin, heparin, pork or pork products, other medications, foods, dyes, or preservatives Pregnant or trying to get pregnant Breastfeeding How should I use this medication? This medication is injected under the skin. You will be taught how to prepare and give it. For your therapy to work as well as possible, take each dose  exactly as prescribed on the prescription label. Do not skip doses. Skipping or stopping this medication can increase your risk of a blood clot. Keep taking this medication unless your care team tells you to stop. It is important that you put your used needles and syringes in a special sharps container. Do not put them in a trash can. If you do not have a sharps container, call your care team to get one. This medication comes with INSTRUCTIONS FOR USE. Ask your pharmacist for directions on how to use this medicine. Read the information carefully. Talk to your care team if you have questions. Talk to your care team about use of this medication in children. Special care may be needed. Overdosage: If you think you have taken too much of this medicine contact a poison control center or emergency room at once. NOTE: This medicine is only for you. Do not share this medicine with others. What if I miss a dose? If you miss a dose, take it as soon as you can. If it is almost time for your next dose, take only that dose. Do not take double or extra doses. What may interact with this medication? Aspirin and aspirin-like medications Certain medications that prevent or treat blood clots Dipyridamole NSAIDs, medications for pain and inflammation, such as ibuprofen or naproxen This list may not describe all possible interactions. Give your health care provider a list of all the medicines, herbs, non-prescription drugs, or dietary supplements you use. Also tell them if you smoke, drink alcohol, or use illegal drugs. Some items may interact with your medicine. What should I watch for while using this medication? Visit your care team for regular checks on your progress. You may need blood work done while you are taking this medication. Your condition will be monitored carefully while you are receiving this medication. It is important not to miss any appointments. This medication comes in different strengths. Make sure  you receive the same strength of medication with each refill. Contact your care team before using this medication if you are given a different strength. If you are going to need surgery or other procedure, tell your care team that you are using this medication. Using this medication for a long time may weaken your bones and increase the risk of bone fractures. Avoid sports and activities that might cause injury while you are using this medication. Severe falls or injuries can cause unseen bleeding. Be careful when using sharp tools or knives. Consider using an Copy. Take special care brushing or flossing your teeth. Report any injuries, bruising, or red spots on the skin to your care team. Wear a medical ID bracelet or chain. Carry a card that describes your disease and details of your medication and dosage times. What side effects may I notice from receiving this medication? Side effects that you should report to your care team as soon as possible: Allergic reactions--skin rash, itching, hives, swelling of the face, lips, tongue, or throat Bleeding--bloody or black, tar-like stools, vomiting blood or brown material that looks like coffee grounds, red or dark brown urine, small red  or purple spots on skin, unusual bruising or bleeding Side effects that usually do not require medical attention (report to your care team if they continue or are bothersome): Pain, redness, or irritation at the injection site Swelling of the ankles, hands, or feet This list may not describe all possible side effects. Call your doctor for medical advice about side effects. You may report side effects to FDA at 1-800-FDA-1088. Where should I keep my medication? Keep out of the reach of children. Store at room temperature between 15 and 30 degrees C (59 and 86 degrees F). Do not freeze. If your injections have been specially prepared, you may need to store them in the refrigerator. Ask your pharmacist. Throw away  any unused medication after the expiration date. NOTE: This sheet is a summary. It may not cover all possible information. If you have questions about this medicine, talk to your doctor, pharmacist, or health care provider.  2023 Elsevier/Gold Standard (2021-01-31 00:00:00)  Warfarin Information Warfarin is a blood thinner (anticoagulant). Anticoagulants help to prevent the formation of blood clots or keep them from getting bigger. Your health care provider will monitor the anticoagulation effect of warfarin closely and will adjust your medicine as needed. Who should use warfarin? Warfarin is prescribed for people who have blood clots, or who are at risk for developing harmful blood clots, such as people who: Have mechanical heart valves. Have irregular heart rhythms (atrial fibrillation). Have certain clotting disorders. Have had blood clots in the past or are currently receiving treatment for them. This includes people who have had a stroke, blood clots in the lungs (pulmonary embolism, or PE), or blood clots in the legs (deep vein thrombosis,or DVT). How is warfarin taken? Warfarin is taken by mouth (orally). Warfarin tablets come in different strengths. The strength is printed on the tablet, and each strength is a different color. If you get a new prescription and the color of your tablet is different than usual, tell your pharmacist or health care provider immediately. Take warfarin exactly as told by your health care provider, at the same time every day. Doing this helps you avoid bleeding or blood clots that could result in serious injury, pain, or disability. Contact your health care provider if a dose is forgotten or missed. Do not change or take additional dosesto make up for missed or accidental extra doses. What blood tests do I need while taking warfarin?  Warfarin is a medicine that needs to be closely monitored with blood tests. It is very important to keep all lab visits and  follow-up visits with your health care provider. These tests measure the blood's ability to clot and are called prothrombin tests (PT)or international normalized ratio (INR) tests. These tests can be done with a finger stick or a blood draw. What does the INR test result mean? The PT test results will be reported as the INR. Your health care provider will tell you your target INR range. If your INR is not in your target range, your health care provider may adjust your dosage. If your INR is above your target range, there is a risk of bleeding. Your dosage of warfarin may need to be decreased. If your INR is below your target range, there is a risk of clotting. Your dosage of warfarin may need to be increased. How often is the INR test needed? When you first start warfarin, you will usually have your INR checked every few days until the health care provider determines the correct  dosage of warfarin. After you have reached your target INR, your INR will be tested less often. However, you will need to have your INR checked at least once every 4-6 weeks while you take warfarin. Some people may be able to use home monitoring to check their INR. Ask your health care provider if this applies to you. What are the side effects of warfarin? Too much warfarin can cause bleeding or hemorrhage in any part of the body, such as: Bleeding from the gums. Unexplained bruises or bruises that get larger. A nosebleed that is not easily stopped. Bleeding in the brain (hemorrhagic stroke). Coughing up or vomiting blood. Blood in the urine or stools. Warfarin may also cause: Skin rash or irritations. Nausea that does not go away. Severe pain in the back or joints. Painful toes that turn blue or purple (purple toe syndrome). Painful ulcers that do not go away (skin necrosis). What precautions do I need to take while using warfarin? Wear a medical alert bracelet or carry a card that lists what medicines you  take. Make sure that all health care providers, including your dentist, know you are taking warfarin. Avoid situations that cause bleeding by: Using a softer toothbrush. Flossing with waxed floss. Shaving with an Copy, not with a blade. Limiting your use of sharp objects. Avoiding activities that put you at risk for injury, such as contact sports. What do I need to know about warfarin and pregnancy or breastfeeding? If you are taking warfarin and you become pregnant, or plan to become pregnant, contact your health care provider right away. Though warfarin has been associated with birth defects, it can be used in some cases after weighing risks to mother and baby. If you plan to breastfeed while taking warfarin, talk with your health care provider first. What do I need to know about warfarin and alcohol or drug use? Do not drink alcohol if: Your health care provider tells you not to drink. You are pregnant, may be pregnant, or are planning to become pregnant. If you drink alcohol: Limit how much you have to: 0-1 drink a day for women. 0-2 drinks a day for men. Know how much alcohol is in your drink. In the [place] one drink equals one 12 oz bottle of beer (355 mL), one 5 oz glass of wine (148 mL), or one 1 oz glass of hard liquor (44 mL). If you change the amount of alcohol that you drink, tell your health care provider. Your warfarin dosage may need to be changed. Do not use any products that contain nicotine or tobacco. These products include cigarettes, chewing tobacco, and vaping devices, such as e-cigarettes. If you need help quitting, ask your health care provider. If you use nicotine or tobacco products and change the amount that you use, tell your health care provider. Your warfarin dosage may need to be changed. Avoid drug use while taking warfarin. The effects of drugs on warfarin are not known. What do I need to know about warfarin and other medicines or supplements? Many  prescription and over-the-counter medicines can interfere with warfarin. Talk with your health care provider or your pharmacist before starting or stopping any new medicines. This includes vitamins, herbs, supplements, and pain medicines. Some common over-the-counter medicines that may increase the risk of dangerous bleeding while taking warfarin include: Aspirin. NSAIDs, such as ibuprofen or naproxen. Vitamin E. Fish oils. What do I need to know about warfarin and my diet? Vitamin K decreases the effect of warfarin,  and it is found in many foods. Eat a consistent amount of foods that contain vitamin K. For example, you may decide to eat 2 servings of vitamin K-containing foods each day. It is important to maintain a normal, balanced diet while taking warfarin. Avoid major changes in your diet. If you are going to change your diet, talk with your health care provider before making changes. Your health care provider may recommend that you work with a dietitian. Contact a health care provider if you: Miss a dose. Take an extra dose. Plan to have any kind of surgery or procedure. Ask whether you should stop taking warfarin or change your dose before your surgery. Are unable to take your medicine due to nausea, vomiting, or diarrhea. Have any major changes in your diet, or you plan to make major changes in your diet. Start or stop any over-the-counter medicine, prescription medicine, herbal supplement, or dietary supplement. Become pregnant, plan to become pregnant, or think you may be pregnant. Have menstrual periods that are heavier than usual. Have unusual bruising. Get help right away if you: Have signs of an allergic reaction, such as: Swelling of the lips, face, tongue, mouth, or throat. Rash or itchy, red, swollen areas of skin (hives). Trouble breathing. Chest tightness. Fall or have an accident, especially if you hit your head. Have signs that your blood is too thin, such as: Blood  in your urine. Your urine may look reddish, pinkish, or tea-colored. Blood in your stool. Your stool may be black or bright red. Coughing up or vomiting blood. The blood may be bright red, or it may look like coffee grounds. Bleeding that does not stop after applying pressure to the area for 30 minutes. Have signs of a blood clot in your leg or arm, such as: Pain or swelling in your leg or arm. Skin that is red or warm to the touch on your arm or leg. Have signs of blood in your lung, such as: Shortness of breath or difficulty breathing. Chest pain. Unexplained fever. Have any symptoms of a stroke. "BE FAST" is an easy way to remember the main warning signs of a stroke: B - Balance. Signs are dizziness, sudden trouble walking, or loss of balance. E - Eyes. Signs are trouble seeing or a sudden change in vision. F - Face. Signs are sudden weakness or numbness of the face, or the face or eyelid drooping on one side. A - Arms. Signs are weakness or numbness in an arm. This happens suddenly and usually on one side of the body. S - Speech. Signs are sudden trouble speaking, slurred speech, or trouble understanding what people say. T - Time. Time to call emergency services. Write down what time symptoms started. Have other signs of a stroke, such as: A sudden, severe headache with no known cause. Nausea or vomiting. Seizure. Have other signs of a reaction to warfarin, such as: Purple or blue toes. Skin ulcers that do not go away. These symptoms may represent a serious problem that is an emergency. Do not wait to see if the symptoms will go away. Get medical help right away. Call your local emergency services (911 in the [place] Do not drive yourself to the hospital. Summary Warfarin is a medicine that thins blood. It is used to prevent or treat blood clots. You must be monitored closely while on this medicine. Keep all follow-up visits. Make sure that you know your target INR range and your  warfarin dosage. Wear or carry  identification that says you are taking warfarin. Take warfarin at the same time every day. Call your health care provider if you miss a dose or if you take an extra dose. Do not change the dosage of warfarin on your own. Know the signs and symptoms of blood clots, bleeding, and a stroke. Know when to get emergency medical help. This information is not intended to replace advice given to you by your health care provider. Make sure you discuss any questions you have with your health care provider. Document Revised: 02/15/2021 Document Reviewed: 02/15/2021 Elsevier Patient Education  [place] and Warfarin Warfarin is a blood thinner (anticoagulant). Anticoagulant medicines help prevent blood clots from forming or getting bigger. Warfarin works by blocking the activity of vitamin K. Vitamin K promotes normal blood clotting. When you take warfarin, problems can occur from suddenly increasing or decreasing the amount of vitamin K that you eat from one day to the next. These problems can occur due to varying levels of warfarin in your blood. Problems may include blood clots or bleeding. What are tips for eating the right amount of vitamin K? Reading food labels Know which foods contain vitamin K. Read food labels. Use the lists below to understand serving sizes and the amount of vitamin K in one serving. If you take a multivitamin that contains vitamin K, be sure to take it every day. Meal planning To avoid problems when taking warfarin: Eat a balanced diet that includes: Fresh fruits and vegetables. Whole grains. Low-fat dairy products. Lean proteins, such as fish, eggs, and lean cuts of meat. Avoid major changes in your diet. If you are going to change your diet, talk with your health care provider before making changes. Keep your intake of vitamin K consistent from day to day. Avoid eating large amounts of vitamin K one day and small  amounts of vitamin K the next day. Work with a dietitian to develop a meal plan that works best for you.  What foods are high in vitamin K? Foods that are high in vitamin K contain more than 100 mcg (micrograms) per serving. These include: Broccoli (cooked from fresh) -  cup (78 g) has 110 mcg. Brussels sprouts (cooked from fresh) -  cup (78 g) has 109 mcg. Greens, beet (cooked from fresh) -  cup (72 g) has 350 mcg. Greens, collard (cooked from fresh) -  cup (66 g) has 263 mcg. Greens, turnip (cooked from fresh) -  cup (72 g) has 265 mcg. Green onions or scallions -  cup (50 g) has 105 mcg. Kale (cooked from fresh) -  cup (68 g) has 536 mcg. Parsley (raw) - 10 sprigs (10 g) has 164 mcg. Spinach (cooked from fresh) -  cup (90 g) has 444 mcg. Swiss chard (cooked from fresh) -  cup (88 g) has 287 mcg. The items listed above may not be a complete list of foods high in Vitamin K. Actual amounts of Vitamin K may differ depending on processing. Contact a dietitian for more information. What foods have a moderate amount of vitamin K? Foods that have a moderate amount of vitamin K contain 25-100 mcg per serving. These include: Asparagus (cooked from fresh) - 4 spears (60 g) have 30 mcg. Black-eyed peas (dried) -  cup (85 g) has 32 mcg. Cabbage (cooked from fresh) -  cup (78 g) has 84 mcg. Cabbage (raw) -  cup (35 g) has 26 mcg. Kiwi fruit - 1 medium (69  g) has 27 mcg. Lettuce (raw) - 1 cup (36 g) has 45 mcg. Okra (cooked from fresh) -  cup (80 g) has 32 mcg. Prunes (dried) - 5 prunes (47 g) have 25 mcg. Tuna, light, canned in oil - 3 oz (85 g) has 37 mcg. Watercress (raw) - 1 cup (34 g) has 85 mcg. The items listed above may not be a complete list of foods with a moderate amount of Vitamin K. Actual amounts of Vitamin K may differ depending on processing. Contact a dietitian for more information. What foods are low in vitamin K? Foods low in vitamin K contain less than 25 mcg per  serving. These include: Artichoke - 1 medium (128 g) has 18 mcg. Avocado - 1 oz (21 g) has 6 mcg. Blueberries -  cup (73 g) has 14 mcg. Carrots (cooked from fresh) -  cup (78 g) has 11 mcg. Cauliflower (raw) -  cup (54 g) has 8 mcg. Cucumber with peel (raw) -  cup (52 g) has 9 mcg. Grapes -  cup (76 g) has 12 mcg. Mango - 1 medium (207 g) has 9 mcg. Mixed nuts - 1 cup (142 g) has 17 mcg. Pear - 1 medium (178 g) has 8 mcg. Peas (cooked from fresh) -  cup (80 g) has 20 mcg. Pickled cucumber - 1 spear (65 g) has 11 mcg. Sauerkraut (canned) -  cup (118 g) has 16 mcg. Soybeans (cooked from fresh) -  cup (86 g) has 16 mcg. Tomato (raw) - 1 medium (123 g) has 10 mcg. Tomato sauce (raw) -  cup (123 g) has 17 mcg. The items listed above may not be a complete list of foods low in Vitamin K. Actual amounts of Vitamin K may differ depending on processing. Contact a dietitian for more information. What foods do not have vitamin K? If a food contains less than 5 mcg per serving, it is considered to have no vitamin K. These foods include: Bread and cereal products. Cheese. Eggs. Fish and shellfish. Meat and poultry. Milk and dairy products. Seeds, such as sunflower or pumpkin seeds. The items listed above may not be a complete list of foods that do not have vitamin K. Actual amounts of vitamin K may differ depending on processing. Contact a dietitian for more information. Summary Warfarin is an anticoagulant that prevents blood clots from forming or getting bigger by blocking the activity of vitamin K. It is important to know the amount of vitamin K that is in the foods you eat and to keep your intake of vitamin K consistent from day to day. Avoid major changes in your diet. If you are going to change your diet, talk with your health care provider before making changes. This information is not intended to replace advice given to you by your health care provider. Make sure you discuss any  questions you have with your health care provider. Document Revised: 02/01/2021 Document Reviewed: 02/01/2021 Elsevier Patient Education  [place].

## 2023-02-08 ENCOUNTER — Telehealth: Payer: Self-pay | Admitting: Oncology

## 2023-02-08 ENCOUNTER — Telehealth (HOSPITAL_COMMUNITY): Payer: Self-pay | Admitting: Student-PharmD

## 2023-02-08 NOTE — Telephone Encounter (Signed)
I called the patient's PCP to follow up regarding coordination of care after yesterday's visit in the DVT Clinic. I spoke with Evelena Peat, CMA, who took notes to share with his provider.   I explained that the patient was started on warfarin with a Lovenox bridge yesterday for DVT treatment due to phenytoin interaction with DOACs. She confirmed that their office will do INR monitoring for the patient, which is his preference. Their clinical pharmacist or NPs manage warfarin in their practice. Provided her with warfarin and Lovenox doses started yesterday, INR goal of 2-3, and recommendation to check INR mid next week after at least 5 doses of warfarin. I also let her know I educated the patient to take warfarin in the afternoons so if a change in dose was needed based on his INR at his appointments, that change could be made the same day. She confirmed that their office will reach out to the patient to set up his INR monitoring appts.   I informed her of warfarin interaction with phenytoin, which can increase phenytoin concentrations. She will make his PCP aware in the event closer phenytoin concentration monitoring is indicated.   Patient was taking aspirin 325 mg daily for stroke prevention s/p TIA 25 years ago, per patient report. He has a history of hematuria on aspirin previously and melena on Pepto-Bismol, with no bleeding recurrence since resuming aspirin. However given this history, the high aspirin dose, and the patient starting anticoagulation, I had him hold aspirin until his PCP could inform him whether he should continue this or not while on anticoagulation. If continuation of aspirin is necessary, would at least consider reducing dose to 81 mg. I shared this with the [facility] who confirmed that they will advise the patient on what to do.   She let me know that they have not yet received the [place] report from yesterday and asked that I provide the office with their new clinical fax number [phone]).  I've asked Joneen Caraway, DVT nurse navigator, to reach out to imaging to ask they fax the report there.   Evelena Peat shared that their office will call the patient to address all of the above and was grateful for my coordination of care for the patient.   I called the patient just to let him know of these updates and that his PCP office will be reaching out to him to schedule his INR appt. Will need to schedule follow up appt in DVT Clinic - likely in 3 months after he has had appointments with hematology and PCP. Patient did not answer. LVM requesting call back.

## 2023-02-08 NOTE — Telephone Encounter (Signed)
Scheduled appt per 2/29 referral. Pt is aware of appt date and time. Pt is aware to arrive 15 mins prior to appt time and to bring and updated insurance card. Pt is aware of appt location.

## 2023-02-12 DIAGNOSIS — I82431 Acute embolism and thrombosis of right popliteal vein: Secondary | ICD-10-CM | POA: Diagnosis not present

## 2023-02-12 DIAGNOSIS — G40909 Epilepsy, unspecified, not intractable, without status epilepticus: Secondary | ICD-10-CM | POA: Diagnosis not present

## 2023-02-12 DIAGNOSIS — Z8673 Personal history of transient ischemic attack (TIA), and cerebral infarction without residual deficits: Secondary | ICD-10-CM | POA: Diagnosis not present

## 2023-02-12 DIAGNOSIS — Z7901 Long term (current) use of anticoagulants: Secondary | ICD-10-CM | POA: Diagnosis not present

## 2023-02-14 ENCOUNTER — Other Ambulatory Visit (HOSPITAL_COMMUNITY): Payer: Self-pay

## 2023-02-14 NOTE — Addendum Note (Signed)
Addended by: Christy Gentles on: 02/14/2023 09:58 AM   Modules accepted: Orders

## 2023-02-14 NOTE — Telephone Encounter (Signed)
Anthony Walters returned my call. He informed me that saw Tivis Ringer CPP at [facility] for his first INR check two days ago and it was therapeutic at 2.1, so he was able to stop taking the Lovenox injections. I'll remove this from his medication list, and he will keep the remaining boxes of Lovenox on hand if he needs it again in the future. He sees her again next week. He also sees hematology on 03/01/23 who will help guide duration of treatment. [name] is following his phenytoin monitoring as well. Since he is in good hands with [name], and hematology will communicate with their office recommendations for duration of anticoagulation, patient will continue to follow up with them as scheduled and in the DVT Clinic just as needed.   He also called to ask how he can pay for his prescription that was filled at the [facility] during his visit. I transferred him to the [facility] to pay for the prescription over the phone.

## 2023-02-19 DIAGNOSIS — I82431 Acute embolism and thrombosis of right popliteal vein: Secondary | ICD-10-CM | POA: Diagnosis not present

## 2023-02-19 DIAGNOSIS — Z7901 Long term (current) use of anticoagulants: Secondary | ICD-10-CM | POA: Diagnosis not present

## 2023-02-19 DIAGNOSIS — Z79899 Other long term (current) drug therapy: Secondary | ICD-10-CM | POA: Diagnosis not present

## 2023-03-01 ENCOUNTER — Other Ambulatory Visit: Payer: Self-pay | Admitting: *Deleted

## 2023-03-01 ENCOUNTER — Other Ambulatory Visit: Payer: Self-pay

## 2023-03-01 ENCOUNTER — Inpatient Hospital Stay: Payer: Medicare HMO | Attending: Oncology | Admitting: Oncology

## 2023-03-01 ENCOUNTER — Inpatient Hospital Stay: Payer: Medicare HMO

## 2023-03-01 VITALS — BP 158/79 | HR 64 | Temp 97.8°F | Resp 14 | Ht 72.0 in | Wt 194.1 lb

## 2023-03-01 DIAGNOSIS — Z833 Family history of diabetes mellitus: Secondary | ICD-10-CM | POA: Diagnosis not present

## 2023-03-01 DIAGNOSIS — M7989 Other specified soft tissue disorders: Secondary | ICD-10-CM | POA: Diagnosis not present

## 2023-03-01 DIAGNOSIS — H409 Unspecified glaucoma: Secondary | ICD-10-CM

## 2023-03-01 DIAGNOSIS — Z79899 Other long term (current) drug therapy: Secondary | ICD-10-CM | POA: Diagnosis not present

## 2023-03-01 DIAGNOSIS — Z7901 Long term (current) use of anticoagulants: Secondary | ICD-10-CM

## 2023-03-01 DIAGNOSIS — Z8719 Personal history of other diseases of the digestive system: Secondary | ICD-10-CM

## 2023-03-01 DIAGNOSIS — Z8249 Family history of ischemic heart disease and other diseases of the circulatory system: Secondary | ICD-10-CM | POA: Diagnosis not present

## 2023-03-01 DIAGNOSIS — C61 Malignant neoplasm of prostate: Secondary | ICD-10-CM

## 2023-03-01 DIAGNOSIS — I10 Essential (primary) hypertension: Secondary | ICD-10-CM

## 2023-03-01 DIAGNOSIS — I82431 Acute embolism and thrombosis of right popliteal vein: Secondary | ICD-10-CM

## 2023-03-01 DIAGNOSIS — Z8546 Personal history of malignant neoplasm of prostate: Secondary | ICD-10-CM

## 2023-03-01 DIAGNOSIS — Z823 Family history of stroke: Secondary | ICD-10-CM | POA: Diagnosis not present

## 2023-03-01 DIAGNOSIS — Z8673 Personal history of transient ischemic attack (TIA), and cerebral infarction without residual deficits: Secondary | ICD-10-CM | POA: Diagnosis not present

## 2023-03-01 DIAGNOSIS — E119 Type 2 diabetes mellitus without complications: Secondary | ICD-10-CM | POA: Diagnosis not present

## 2023-03-01 DIAGNOSIS — E785 Hyperlipidemia, unspecified: Secondary | ICD-10-CM

## 2023-03-01 DIAGNOSIS — Z86010 Personal history of colonic polyps: Secondary | ICD-10-CM | POA: Diagnosis not present

## 2023-03-01 DIAGNOSIS — I82451 Acute embolism and thrombosis of right peroneal vein: Secondary | ICD-10-CM

## 2023-03-01 DIAGNOSIS — Z881 Allergy status to other antibiotic agents status: Secondary | ICD-10-CM

## 2023-03-01 DIAGNOSIS — D126 Benign neoplasm of colon, unspecified: Secondary | ICD-10-CM

## 2023-03-01 DIAGNOSIS — Z87891 Personal history of nicotine dependence: Secondary | ICD-10-CM

## 2023-03-01 LAB — CBC WITH DIFFERENTIAL (CANCER CENTER ONLY)
Abs Immature Granulocytes: 0.01 10*3/uL (ref 0.00–0.07)
Basophils Absolute: 0 10*3/uL (ref 0.0–0.1)
Basophils Relative: 1 %
Eosinophils Absolute: 0 10*3/uL (ref 0.0–0.5)
Eosinophils Relative: 1 %
HCT: 45.3 % (ref 39.0–52.0)
Hemoglobin: 14.4 g/dL (ref 13.0–17.0)
Immature Granulocytes: 0 %
Lymphocytes Relative: 36 %
Lymphs Abs: 2 10*3/uL (ref 0.7–4.0)
MCH: 29.1 pg (ref 26.0–34.0)
MCHC: 31.8 g/dL (ref 30.0–36.0)
MCV: 91.5 fL (ref 80.0–100.0)
Monocytes Absolute: 0.5 10*3/uL (ref 0.1–1.0)
Monocytes Relative: 9 %
Neutro Abs: 3.1 10*3/uL (ref 1.7–7.7)
Neutrophils Relative %: 53 %
Platelet Count: 246 10*3/uL (ref 150–400)
RBC: 4.95 MIL/uL (ref 4.22–5.81)
RDW: 13 % (ref 11.5–15.5)
WBC Count: 5.7 10*3/uL (ref 4.0–10.5)
nRBC: 0 % (ref 0.0–0.2)

## 2023-03-01 LAB — CMP (CANCER CENTER ONLY)
ALT: 31 U/L (ref 0–44)
AST: 18 U/L (ref 15–41)
Albumin: 4.3 g/dL (ref 3.5–5.0)
Alkaline Phosphatase: 152 U/L — ABNORMAL HIGH (ref 38–126)
Anion gap: 5 (ref 5–15)
BUN: 7 mg/dL — ABNORMAL LOW (ref 8–23)
CO2: 32 mmol/L (ref 22–32)
Calcium: 9.8 mg/dL (ref 8.9–10.3)
Chloride: 102 mmol/L (ref 98–111)
Creatinine: 0.9 mg/dL (ref 0.61–1.24)
GFR, Estimated: >60 mL/min (ref >60)
Glucose, Bld: 82 mg/dL (ref 70–99)
Potassium: 3.7 mmol/L (ref 3.5–5.1)
Sodium: 139 mmol/L (ref 135–145)
Total Bilirubin: 0.3 mg/dL (ref 0.3–1.2)
Total Protein: 7.7 g/dL (ref 6.5–8.1)

## 2023-03-01 LAB — D-DIMER, QUANTITATIVE: D-Dimer, Quant: 1.85 ug/mL-FEU — ABNORMAL HIGH (ref 0.00–0.50)

## 2023-03-01 LAB — PROTIME-INR
INR: 3 — ABNORMAL HIGH (ref 0.8–1.2)
Prothrombin Time: 30.8 seconds — ABNORMAL HIGH (ref 11.4–15.2)

## 2023-03-01 LAB — APTT: aPTT: 33 seconds (ref 24–36)

## 2023-03-01 NOTE — Progress Notes (Unsigned)
[facility] Initial Visit:  Patient Care Team: Donnajean Lopes, MD as PCP - General (Internal Medicine)  CHIEF COMPLAINTS/PURPOSE OF CONSULTATION:   HISTORY OF PRESENTING ILLNESS: Anthony Pullar Sr. 82 y.o. male is here because of  DVT Medical history notable for prostate cancer treated with seed implants, diabetes mellitus type 2, glaucoma, hyperlipidemia, hypertension, stroke, tubular adenoma of colon  May 02, 2022: Colonoscopy demonstrated 6 sessile polyps in transverse and ascending colon 5 to 8 mm in size  February 07, 2023: Right lower extremity ultrasound to evaluate edema and discomfort of 5 days duration.  This demonstrated acute thrombus in the popliteal and peroneal veins and age-indeterminate thrombus in posterior tibial and superficial saphenous veins  March 08, 2023: WBC 6.0 hemoglobin 13.5 platelet count 221 CMP notable for glucose of 108  March 01 2023: [facility] Patient was doing well prior to the diagnosis of the DVT.  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  States that he woke up one morning and noted swelling in the right calf.  No pain.  No chest pain, SOB, cough.    Patient was placed on Lovenox then switched to coumadin.  His total weekly dose of coumadin is 35 mg which is administered as 5 mg on 5 days a week and 2.5 mg on 2 days a week.  This is being managed by PCP.  No bleeding problems on coumadin.    Social:  Retired.  Former short Associate Professor.  Tobacco none.  EtOH none    Review of Systems  Constitutional:  Negative for appetite change, chills, fatigue, fever and unexpected weight change.  HENT:   Negative for hearing loss, lump/mass, mouth sores, nosebleeds, sore throat, tinnitus, trouble swallowing and voice change.   Eyes:  Negative for eye problems and icterus.       Vision changes:  None  Respiratory:  Negative for chest tightness, cough, hemoptysis, shortness of breath and wheezing.         PND:  none Orthopnea:  none DOE:    Cardiovascular:  Negative for chest pain, leg swelling and palpitations.       PND:  none Orthopnea:  none  Gastrointestinal:  Negative for abdominal pain, blood in stool, constipation, diarrhea, nausea and vomiting.  Endocrine: Negative for hot flashes.       Cold intolerance:  none Heat intolerance:  none  Genitourinary:  Positive for nocturia. Negative for bladder incontinence, difficulty urinating, dysuria, frequency and hematuria.   Musculoskeletal:  Negative for back pain, gait problem, myalgias, neck pain and neck stiffness.       Mild arthralgias and myalgias  Skin:  Negative for itching, rash and wound.  Neurological:  Negative for dizziness, extremity weakness, gait problem, headaches, light-headedness, numbness, seizures and speech difficulty.  Hematological:  Negative for adenopathy. Does not bruise/bleed easily.  Psychiatric/Behavioral:  Negative for sleep disturbance and suicidal ideas. The patient is not nervous/anxious.    MEDICAL HISTORY: Past Medical History:  Diagnosis Date  . Adenocarcinoma of prostate ([place]) 10/14  . Arthritis   . Diabetes mellitus without complication ([place])   . Glaucoma   . Hyperlipidemia   . Hypertension   . Seizures ([place])   . Stroke ([place])   . TIA (transient ischemic attack)   . Tubular adenoma of colon 10/2010    SURGICAL HISTORY: Past Surgical History:  Procedure Laterality Date  . COLONOSCOPY    . DENTAL SURGERY  implants  . LEFT EYE SURGERY    . MENISCUS REPAIR Right   . POLYPECTOMY    . PROSTATE BIOPSY    . RADIOACTIVE SEED IMPLANT N/A 04/27/2019   Procedure: RADIOACTIVE SEED IMPLANT/BRACHYTHERAPY IMPLANT;  Surgeon: Franchot Gallo, MD;  Location: [facility];  Service: Urology;  Laterality: N/A;  . SPACE OAR INSTILLATION N/A 04/27/2019   Procedure: SPACE OAR INSTILLATION;  Surgeon: Franchot Gallo, MD;  Location: [facility];  Service: Urology;  Laterality: N/A;  . TONSILLECTOMY    .  VASECTOMY      SOCIAL HISTORY: Social History   Socioeconomic History  . Marital status: Divorced    Spouse name: Not on file  . Number of children: 4  . Years of education: Not on file  . Highest education level: Not on file  Occupational History  . Occupation: retired    Comment: truck Geophysicist/field seismologist  Tobacco Use  . Smoking status: Former    Packs/day: 0.00    Years: 0.50    Additional pack years: 0.00    Total pack years: 0.00    Types: Cigars, Cigarettes    Quit date: 09/10/1991    Years since quitting: 31.4  . Smokeless tobacco: Never  Vaping Use  . Vaping Use: Never used  Substance and Sexual Activity  . Alcohol use: No    Alcohol/week: 0.0 standard drinks of alcohol  . Drug use: No  . Sexual activity: Yes  Other Topics Concern  . Not on file  Social History Narrative  . Not on file   Social Determinants of Health   Financial Resource Strain: Low Risk  (03/01/2023)   Overall Financial Resource Strain (CARDIA)   . Difficulty of Paying Living Expenses: Not hard at all  Food Insecurity: No Food Insecurity (03/01/2023)   Hunger Vital Sign   . Worried About Charity fundraiser in the Last Year: Never true   . Ran Out of Food in the Last Year: Never true  Transportation Needs: No Transportation Needs (03/01/2023)   PRAPARE - Transportation   . Lack of Transportation (Medical): No   . Lack of Transportation (Non-Medical): No  Physical Activity: Not on file  Stress: Not on file  Social Connections: Not on file  Intimate Partner Violence: Not At Risk (03/01/2023)   Humiliation, Afraid, Rape, and Kick questionnaire   . Fear of Current or Ex-Partner: No   . Emotionally Abused: No   . Physically Abused: No   . Sexually Abused: No    FAMILY HISTORY Family History  Problem Relation Age of Onset  . Heart failure Mother   . Diabetes Mother   . Heart attack Father   . Prostate cancer Brother   . Stroke Granddaughter   . Colon cancer Neg Hx   . Stomach cancer Neg Hx   .  Esophageal cancer Neg Hx   . Rectal cancer Neg Hx     ALLERGIES:  is allergic to clindamycin/lincomycin, grapefruit concentrate, ace inhibitors, and ciprofloxacin.  MEDICATIONS:  Current Outpatient Medications  Medication Sig Dispense Refill  . aspirin EC 325 MG tablet Take 325 mg by mouth daily.    . bimatoprost (LUMIGAN) 0.01 % SOLN Place 1 drop into both eyes at bedtime.     . cloNIDine (CATAPRES) 0.2 MG tablet Take 0.2 mg by mouth 3 (three) times daily.    . dorzolamide-timolol (COSOPT) 22.3-6.8 MG/ML ophthalmic solution INSTILL 1 DROP INTO EACH EYE TWICE DAILY Ophthalmic for 38 Days    . gabapentin (NEURONTIN) 300  MG capsule Take 300 mg by mouth 2 (two) times daily as needed (neuropathy).     . metFORMIN (GLUCOPHAGE-XR) 500 MG 24 hr tablet Take 500 mg by mouth daily with supper.    . metoprolol succinate (TOPROL-XL) 50 MG 24 hr tablet Take 1 tablet by mouth 2 (two) times daily.    . phenytoin (DILANTIN) 100 MG ER capsule Take 100 mg by mouth 2 (two) times daily.     . rosuvastatin (CRESTOR) 5 MG tablet Take 5 mg by mouth daily.    . tamsulosin (FLOMAX) 0.4 MG CAPS capsule Take 0.4 mg by mouth 2 (two) times daily.    Marland Kitchen triamterene-hydrochlorothiazide (MAXZIDE-25) 37.5-25 MG tablet Take 1 tablet by mouth every morning.    . verapamil (CALAN-SR) 240 MG CR tablet Take 240 mg by mouth daily.     Marland Kitchen warfarin (COUMADIN) 5 MG tablet Take 1 tablet (5 mg total) by mouth daily. Or as instructed by doctor's office. 30 tablet 0   No current facility-administered medications for this visit.    PHYSICAL EXAMINATION:  ECOG PERFORMANCE STATUS: {CHL ONC ECOG [id number]   Vitals:   03/01/23 1125  BP: (!) 158/79  Pulse: 64  Resp: 14  Temp: 97.8 F (36.6 C)  SpO2: 99%    Filed Weights   03/01/23 1125  Weight: 194 lb 1.6 oz (88 kg)     Physical Exam Vitals and nursing note reviewed.  Constitutional:      Appearance: Normal appearance. He is not diaphoretic.  HENT:     Head:  Normocephalic and atraumatic.     Right Ear: External ear normal.     Left Ear: External ear normal.     Nose: Nose normal.  Eyes:     Conjunctiva/sclera: Conjunctivae normal.     Pupils: Pupils are equal, round, and reactive to light.  Cardiovascular:     Rate and Rhythm: Normal rate and regular rhythm.     Heart sounds:     No friction rub. No gallop.  Musculoskeletal:     Cervical back: Normal range of motion and neck supple. No rigidity or tenderness.  Lymphadenopathy:     Head:     Right side of head: No submental, submandibular, tonsillar, preauricular, posterior auricular or occipital adenopathy.     Left side of head: No submental, submandibular, tonsillar, preauricular, posterior auricular or occipital adenopathy.     Cervical: No cervical adenopathy.     Right cervical: No superficial, deep or posterior cervical adenopathy.    Left cervical: No superficial, deep or posterior cervical adenopathy.     Upper Body:     Right upper body: No supraclavicular or axillary adenopathy.     Left upper body: No supraclavicular or axillary adenopathy.     Lower Body: No right inguinal adenopathy. No left inguinal adenopathy.  Skin:    Coloration: Skin is not jaundiced.  Neurological:     General: No focal deficit present.     Mental Status: He is alert and oriented to person, place, and time. Mental status is at baseline.  Psychiatric:        Mood and Affect: Mood normal.        Behavior: Behavior normal.        Thought Content: Thought content normal.        Judgment: Judgment normal.    LABORATORY DATA: I have personally reviewed the data as listed:  Hospital Outpatient Visit on 02/07/2023  Component Date Value Ref Range Status  .  WBC 02/07/2023 6.0  4.0 - 10.5 K/uL Final  . RBC 02/07/2023 4.59  4.22 - 5.81 MIL/uL Final  . Hemoglobin 02/07/2023 13.5  13.0 - 17.0 g/dL Final  . HCT 02/07/2023 42.0  39.0 - 52.0 % Final  . MCV 02/07/2023 91.5  80.0 - 100.0 fL Final  . MCH  02/07/2023 29.4  26.0 - 34.0 pg Final  . MCHC 02/07/2023 32.1  30.0 - 36.0 g/dL Final  . RDW 02/07/2023 13.2  11.5 - 15.5 % Final  . Platelets 02/07/2023 221  150 - 400 K/uL Final  . nRBC 02/07/2023 0.0  0.0 - 0.2 % Final   Performed at [facility], [place] [address], [place], [place] [address]  . Sodium 02/07/2023 137  135 - 145 mmol/L Final  . Potassium 02/07/2023 4.0  3.5 - 5.1 mmol/L Final  . Chloride 02/07/2023 100  98 - 111 mmol/L Final  . CO2 02/07/2023 27  22 - 32 mmol/L Final  . Glucose, Bld 02/07/2023 108 (H)  70 - 99 mg/dL Final   Glucose reference range applies only to samples taken after fasting for at least 8 hours.  . BUN 02/07/2023 6 (L)  8 - 23 mg/dL Final  . Creatinine, Ser 02/07/2023 0.86  0.61 - 1.24 mg/dL Final  . Calcium 02/07/2023 9.4  8.9 - 10.3 mg/dL Final  . Total Protein 02/07/2023 7.2  6.5 - 8.1 g/dL Final  . Albumin 02/07/2023 3.5  3.5 - 5.0 g/dL Final  . AST 02/07/2023 18  15 - 41 U/L Final  . ALT 02/07/2023 21  0 - 44 U/L Final  . Alkaline Phosphatase 02/07/2023 136 (H)  38 - 126 U/L Final  . Total Bilirubin 02/07/2023 0.4  0.3 - 1.2 mg/dL Final  . GFR, Estimated 02/07/2023 >60  >60 mL/min Final   Comment: (NOTE) Calculated using the CKD-EPI Creatinine Equation (2021)   . Anion gap 02/07/2023 10  5 - 15 Final   Performed at [facility] [address] [place], [place] [address]    RADIOGRAPHIC STUDIES: I have personally reviewed the radiological images as listed and agree with the findings in the report  No results found.  ASSESSMENT/PLAN Cancer Staging  No matching staging information was found for the patient.   No problem-specific Assessment & Plan notes found for this encounter.    No orders of the defined types were placed in this encounter.     minutes was spent in patient care.  This included time spent preparing to see the patient (e.g., review of tests), obtaining and/or reviewing separately obtained history, counseling  and educating the patient/family/caregiver, ordering medications, tests, or procedures; documenting clinical information in the electronic or other health record, independently interpreting results and communicating results to the patient/family/caregiver as well as coordination of care.       All questions were answered. The patient knows to call the clinic with any problems, questions or concerns.  This note was electronically signed.    Barbee Cough, MD  03/01/2023 11:35 AM

## 2023-03-03 LAB — CARDIOLIPIN ANTIBODIES, IGM+IGG
Anticardiolipin IgG: <9 GPL U/mL (ref 0–14)
Anticardiolipin IgM: <9 MPL U/mL (ref 0–12)

## 2023-03-03 LAB — PROSTATE-SPECIFIC AG, SERUM (LABCORP): Prostate Specific Ag, Serum: <0.1 ng/mL (ref 0.0–4.0)

## 2023-03-04 LAB — BETA-2-GLYCOPROTEIN I ABS, IGG/M/A
Beta-2 Glyco I IgG: <9 GPI IgG units (ref 0–20)
Beta-2-Glycoprotein I IgA: <9 GPI IgA units (ref 0–25)
Beta-2-Glycoprotein I IgM: <9 GPI IgM units (ref 0–32)

## 2023-03-05 DIAGNOSIS — Z7901 Long term (current) use of anticoagulants: Secondary | ICD-10-CM | POA: Diagnosis not present

## 2023-03-05 DIAGNOSIS — I82431 Acute embolism and thrombosis of right popliteal vein: Secondary | ICD-10-CM | POA: Diagnosis not present

## 2023-03-06 DIAGNOSIS — K635 Polyp of colon: Secondary | ICD-10-CM | POA: Insufficient documentation

## 2023-03-18 DIAGNOSIS — M5442 Lumbago with sciatica, left side: Secondary | ICD-10-CM | POA: Diagnosis not present

## 2023-03-18 DIAGNOSIS — Z8546 Personal history of malignant neoplasm of prostate: Secondary | ICD-10-CM | POA: Diagnosis not present

## 2023-03-18 DIAGNOSIS — N5201 Erectile dysfunction due to arterial insufficiency: Secondary | ICD-10-CM | POA: Diagnosis not present

## 2023-03-18 DIAGNOSIS — Z8673 Personal history of transient ischemic attack (TIA), and cerebral infarction without residual deficits: Secondary | ICD-10-CM | POA: Diagnosis not present

## 2023-03-18 DIAGNOSIS — I10 Essential (primary) hypertension: Secondary | ICD-10-CM | POA: Diagnosis not present

## 2023-03-18 DIAGNOSIS — Z7901 Long term (current) use of anticoagulants: Secondary | ICD-10-CM | POA: Diagnosis not present

## 2023-03-18 DIAGNOSIS — E785 Hyperlipidemia, unspecified: Secondary | ICD-10-CM | POA: Diagnosis not present

## 2023-03-18 DIAGNOSIS — E1121 Type 2 diabetes mellitus with diabetic nephropathy: Secondary | ICD-10-CM | POA: Diagnosis not present

## 2023-03-26 DIAGNOSIS — I82431 Acute embolism and thrombosis of right popliteal vein: Secondary | ICD-10-CM | POA: Diagnosis not present

## 2023-03-26 DIAGNOSIS — Z7901 Long term (current) use of anticoagulants: Secondary | ICD-10-CM | POA: Diagnosis not present

## 2023-04-05 ENCOUNTER — Telehealth: Payer: Self-pay | Admitting: Oncology

## 2023-04-05 ENCOUNTER — Ambulatory Visit: Payer: Medicare HMO | Admitting: Oncology

## 2023-04-05 ENCOUNTER — Inpatient Hospital Stay: Payer: Medicare HMO | Admitting: Oncology

## 2023-04-05 DIAGNOSIS — Z8546 Personal history of malignant neoplasm of prostate: Secondary | ICD-10-CM | POA: Diagnosis not present

## 2023-04-05 DIAGNOSIS — C61 Malignant neoplasm of prostate: Secondary | ICD-10-CM | POA: Diagnosis not present

## 2023-04-05 NOTE — Progress Notes (Unsigned)
[facility] Cancer Initial Visit:  Patient Care Team: Garlan Fillers, MD as PCP - General (Internal Medicine)  CHIEF COMPLAINTS/PURPOSE OF CONSULTATION:   HISTORY OF PRESENTING ILLNESS: Anthony Gilkison Sr. 82 y.o. male is here because of  DVT Medical history notable for prostate cancer treated with seed implants, diabetes mellitus type 2, glaucoma, hyperlipidemia, hypertension, stroke, tubular adenoma of colon  May 02, 2022: Colonoscopy demonstrated 6 sessile polyps in transverse and ascending colon 5 to 8 mm in size  February 07, 2023: Right lower extremity ultrasound to evaluate edema and discomfort of 5 days duration.  This demonstrated acute thrombus in the popliteal and peroneal veins and age-indeterminate thrombus in posterior tibial and superficial saphenous veins  March 08, 2023: WBC 6.0 hemoglobin 13.5 platelet count 221 CMP notable for glucose of 108  March 01 2023: [facility] Hematology Consult Patient was doing well prior to the diagnosis of the DVT.  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  States that he woke up one morning and noted swelling in the right calf.  No pain.  No chest pain, SOB, cough.    Patient was placed on Lovenox then switched to coumadin.  His total weekly dose of coumadin is 30 mg which is administered as 5 mg on 5 days a week and 2.5 mg on 2 days a week.  This is being managed by PCP.  No bleeding problems on coumadin.  He is not using testosterone supplements  Social:  Retired.  Former short Production assistant, radio.  Tobacco none.  EtOH none  WBC 5.7 hemoglobin 14.4 platelet count 246; 53 segs 36 lymphs 9 monos 1 eosinophil 1 basophil. Anticardiolipin antibody negative antibeta-2 glycoprotein negative.  D-dimer 1.85 INR 3.0 PTT 33 PSA undetectable Chemistries notable for alk phos 152  April 05, 2023: Telephone visit regarding history of DVT.   Review of Systems  Constitutional:  Negative for appetite change, chills,  fatigue, fever and unexpected weight change.  HENT:   Negative for hearing loss, lump/mass, mouth sores, nosebleeds, sore throat, tinnitus, trouble swallowing and voice change.   Eyes:  Negative for eye problems and icterus.       Vision changes:  None  Respiratory:  Negative for chest tightness, cough, hemoptysis, shortness of breath and wheezing.        PND:  none Orthopnea:  none DOE:    Cardiovascular:  Negative for chest pain, leg swelling and palpitations.       PND:  none Orthopnea:  none  Gastrointestinal:  Negative for abdominal pain, blood in stool, constipation, diarrhea, nausea and vomiting.  Endocrine: Negative for hot flashes.       Cold intolerance:  none Heat intolerance:  none  Genitourinary:  Positive for nocturia. Negative for bladder incontinence, difficulty urinating, dysuria, frequency and hematuria.   Musculoskeletal:  Negative for back pain, gait problem, myalgias, neck pain and neck stiffness.       Mild arthralgias and myalgias  Skin:  Negative for itching, rash and wound.  Neurological:  Negative for dizziness, extremity weakness, gait problem, headaches, light-headedness, numbness, seizures and speech difficulty.  Hematological:  Negative for adenopathy. Does not bruise/bleed easily.  Psychiatric/Behavioral:  Negative for sleep disturbance and suicidal ideas. The patient is not nervous/anxious.     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   Arthritis    Diabetes mellitus without complication (HCC)    Glaucoma  Hyperlipidemia    Hypertension    Seizures (HCC)    Stroke [facility])    TIA (transient ischemic attack)    Tubular adenoma of colon 10/2010    SURGICAL HISTORY: Past Surgical History:  Procedure Laterality Date   COLONOSCOPY     DENTAL SURGERY     implants   LEFT EYE SURGERY     MENISCUS REPAIR Right    POLYPECTOMY     PROSTATE BIOPSY     RADIOACTIVE SEED IMPLANT N/A 04/27/2019    Procedure: RADIOACTIVE SEED IMPLANT/BRACHYTHERAPY IMPLANT;  Surgeon: Marcine Matar, MD;  Location: [facility];  Service: Urology;  Laterality: N/A;   SPACE OAR INSTILLATION N/A 04/27/2019   Procedure: SPACE OAR INSTILLATION;  Surgeon: Marcine Matar, MD;  Location: [facility];  Service: Urology;  Laterality: N/A;   TONSILLECTOMY     VASECTOMY      SOCIAL HISTORY: Social History   Socioeconomic History   Marital status: Divorced    Spouse name: Not on file   Number of children: 4   Years of education: Not on file   Highest education level: Not on file  Occupational History   Occupation: retired    Comment: truck driver  Tobacco Use   Smoking status: Former    Packs/day: 0.00    Years: 0.50    Additional pack years: 0.00    Total pack years: 0.00    Types: Cigars, Cigarettes    Quit date: 09/10/1991    Years since quitting: 31.5   Smokeless tobacco: Never  Vaping Use   Vaping Use: Never used  Substance and Sexual Activity   Alcohol use: No    Alcohol/week: 0.0 standard drinks of alcohol   Drug use: No   Sexual activity: Yes  Other Topics Concern   Not on file  Social History Narrative   Not on file   Social Determinants of Health   Financial Resource Strain: Low Risk  (03/01/2023)   Overall Financial Resource Strain (CARDIA)    Difficulty of Paying Living Expenses: Not hard at all  Food Insecurity: No Food Insecurity (03/01/2023)   Hunger Vital Sign    Worried About Running Out of Food in the Last Year: Never true    Ran Out of Food in the Last Year: Never true  Transportation Needs: No Transportation Needs (03/01/2023)   PRAPARE - Administrator, Civil Service (Medical): No    Lack of Transportation (Non-Medical): No  Physical Activity: Not on file  Stress: Not on file  Social Connections: Not on file  Intimate Partner Violence: Not At Risk (03/01/2023)   Humiliation, Afraid, Rape, and Kick questionnaire    Fear of Current or Ex-Partner: No     Emotionally Abused: No    Physically Abused: No    Sexually Abused: No    FAMILY HISTORY Family History  Problem Relation Age of Onset   Heart failure Mother    Diabetes Mother    Heart attack Father    Prostate cancer Brother    Stroke Granddaughter    Colon cancer Neg Hx    Stomach cancer Neg Hx    Esophageal cancer Neg Hx    Rectal cancer Neg Hx     ALLERGIES:  is allergic to clindamycin/lincomycin, grapefruit concentrate, ace inhibitors, and ciprofloxacin.  MEDICATIONS:  Current Outpatient Medications  Medication Sig Dispense Refill   aspirin EC 325 MG tablet Take 325 mg by mouth daily.     bimatoprost (LUMIGAN) 0.01 % SOLN Place  1 drop into both eyes at bedtime.      cloNIDine (CATAPRES) 0.2 MG tablet Take 0.2 mg by mouth 3 (three) times daily.     dorzolamide-timolol (COSOPT) 22.3-6.8 MG/ML ophthalmic solution INSTILL 1 DROP INTO EACH EYE TWICE DAILY Ophthalmic for 38 Days     gabapentin (NEURONTIN) 300 MG capsule Take 300 mg by mouth 2 (two) times daily as needed (neuropathy).      metFORMIN (GLUCOPHAGE-XR) 500 MG 24 hr tablet Take 500 mg by mouth daily with supper.     metoprolol succinate (TOPROL-XL) 50 MG 24 hr tablet Take 1 tablet by mouth 2 (two) times daily.     phenytoin (DILANTIN) 100 MG ER capsule Take 100 mg by mouth 2 (two) times daily.      rosuvastatin (CRESTOR) 5 MG tablet Take 5 mg by mouth daily.     tamsulosin (FLOMAX) 0.4 MG CAPS capsule Take 0.4 mg by mouth 2 (two) times daily.     triamterene-hydrochlorothiazide (MAXZIDE-25) 37.5-25 MG tablet Take 1 tablet by mouth every morning.     verapamil (CALAN-SR) 240 MG CR tablet Take 240 mg by mouth daily.      warfarin (COUMADIN) 5 MG tablet Take 1 tablet (5 mg total) by mouth daily. Or as instructed by doctor's office. 30 tablet 0   No current facility-administered medications for this visit.    PHYSICAL EXAMINATION:  ECOG PERFORMANCE STATUS: 0 - Asymptomatic   There were no vitals filed for this  visit.   There were no vitals filed for this visit.    Physical Exam Vitals and nursing note reviewed.  Constitutional:      Appearance: Normal appearance. He is not diaphoretic.  HENT:     Head: Normocephalic and atraumatic.     Right Ear: External ear normal.     Left Ear: External ear normal.     Nose: Nose normal.  Eyes:     Conjunctiva/sclera: Conjunctivae normal.     Pupils: Pupils are equal, round, and reactive to light.  Cardiovascular:     Rate and Rhythm: Normal rate and regular rhythm.     Heart sounds:     No friction rub. No gallop.  Musculoskeletal:     Cervical back: Normal range of motion and neck supple. No rigidity or tenderness.  Lymphadenopathy:     Head:     Right side of head: No submental, submandibular, tonsillar, preauricular, posterior auricular or occipital adenopathy.     Left side of head: No submental, submandibular, tonsillar, preauricular, posterior auricular or occipital adenopathy.     Cervical: No cervical adenopathy.     Right cervical: No superficial, deep or posterior cervical adenopathy.    Left cervical: No superficial, deep or posterior cervical adenopathy.     Upper Body:     Right upper body: No supraclavicular or axillary adenopathy.     Left upper body: No supraclavicular or axillary adenopathy.     Lower Body: No right inguinal adenopathy. No left inguinal adenopathy.  Skin:    Coloration: Skin is not jaundiced.  Neurological:     General: No focal deficit present.     Mental Status: He is alert and oriented to person, place, and time. Mental status is at baseline.  Psychiatric:        Mood and Affect: Mood normal.        Behavior: Behavior normal.        Thought Content: Thought content normal.  Judgment: Judgment normal.     LABORATORY DATA: I have personally reviewed the data as listed:  No visits with results within 1 Month(s) from this visit.  Latest known visit with results is:  Orders Only on 03/01/2023   Component Date Value Ref Range Status   Prostate Specific Ag, Serum 03/01/2023 <0.1  0.0 - 4.0 ng/mL Final   Comment: (NOTE) Roche ECLIA methodology. According to the American Urological Association, Serum PSA should decrease and remain at undetectable levels after radical prostatectomy. The AUA defines biochemical recurrence as an initial PSA value 0.2 ng/mL or greater followed by a subsequent confirmatory PSA value 0.2 ng/mL or greater. Values obtained with different assay methods or kits cannot be used interchangeably. Results cannot be interpreted as absolute evidence of the presence or absence of malignant disease. Performed At: [facility] [address] [place], [place] [id number] Jolene Schimke MD WU:[id number]     RADIOGRAPHIC STUDIES: I have personally reviewed the radiological images as listed and agree with the findings in the report  No results found.  ASSESSMENT/PLAN  82 y.o. male is here because of  DVT.  Medical history notable for prostate cancer treated with seed implants, diabetes mellitus type 2, glaucoma, hyperlipidemia, hypertension, stroke, tubular adenoma of colon  RLE DVT February 07 2023- Presented with 5 days history of RLE swelling and discomfort.  Doppler U/S demonstrated acute thrombus in the popliteal and peroneal veins and age-indeterminate thrombus in posterior tibial and superficial saphenous veins   Patient was placed on Coumadin which is being managed by PCP.  Recommend 6 months of anticoagulation   VTE risk factors:  This appears to have been an unprovoked event.  Patient has no discernable risk factors except for age.     March 01 2023- Will obtain the following:  CBC with diff, CMP, PT, PTT, anti-beta2 glycolipoprotein, anticardiolipan antibody, d-dimer  History of prostate cancer:  Treated with radioactive seeds  March 01 2023:  Will check PSA  History of prostate cancer and colon polyps  March 01 2023- Recommended consideration  for genetic counselling   Cancer Staging  No matching staging information was found for the patient.   No problem-specific Assessment & Plan notes found for this encounter.    No orders of the defined types were placed in this encounter.     minutes was spent in patient care.  This included time spent preparing to see the patient (e.g., review of tests), obtaining and/or reviewing separately obtained history, counseling and educating the patient/family/caregiver, ordering medications, tests, or procedures; documenting clinical information in the electronic or other health record, independently interpreting results and communicating results to the patient/family/caregiver as well as coordination of care.       All questions were answered. The patient knows to call the clinic with any problems, questions or concerns.  This note was electronically signed.    Loni Muse, MD  04/05/2023 4:14 PM

## 2023-04-05 NOTE — Telephone Encounter (Signed)
Patient called to see if he could get a call back to get his appointment rescheduled, let patient know we are waiting on [place] to give him a call.

## 2023-04-08 ENCOUNTER — Telehealth: Payer: Self-pay | Admitting: Oncology

## 2023-04-12 DIAGNOSIS — C61 Malignant neoplasm of prostate: Secondary | ICD-10-CM | POA: Diagnosis not present

## 2023-04-12 DIAGNOSIS — Z8546 Personal history of malignant neoplasm of prostate: Secondary | ICD-10-CM | POA: Diagnosis not present

## 2023-04-23 DIAGNOSIS — Z7901 Long term (current) use of anticoagulants: Secondary | ICD-10-CM | POA: Diagnosis not present

## 2023-04-23 DIAGNOSIS — I82431 Acute embolism and thrombosis of right popliteal vein: Secondary | ICD-10-CM | POA: Diagnosis not present

## 2023-05-01 DIAGNOSIS — E119 Type 2 diabetes mellitus without complications: Secondary | ICD-10-CM | POA: Diagnosis not present

## 2023-05-01 DIAGNOSIS — H524 Presbyopia: Secondary | ICD-10-CM | POA: Diagnosis not present

## 2023-05-01 DIAGNOSIS — H401132 Primary open-angle glaucoma, bilateral, moderate stage: Secondary | ICD-10-CM | POA: Diagnosis not present

## 2023-05-01 DIAGNOSIS — H25813 Combined forms of age-related cataract, bilateral: Secondary | ICD-10-CM | POA: Diagnosis not present

## 2023-05-02 DIAGNOSIS — M24572 Contracture, left ankle: Secondary | ICD-10-CM | POA: Diagnosis not present

## 2023-05-02 DIAGNOSIS — M19071 Primary osteoarthritis, right ankle and foot: Secondary | ICD-10-CM | POA: Diagnosis not present

## 2023-05-02 DIAGNOSIS — Q666 Other congenital valgus deformities of feet: Secondary | ICD-10-CM | POA: Diagnosis not present

## 2023-05-02 DIAGNOSIS — M2142 Flat foot [pes planus] (acquired), left foot: Secondary | ICD-10-CM | POA: Diagnosis not present

## 2023-05-02 DIAGNOSIS — I872 Venous insufficiency (chronic) (peripheral): Secondary | ICD-10-CM | POA: Diagnosis not present

## 2023-05-02 DIAGNOSIS — M25872 Other specified joint disorders, left ankle and foot: Secondary | ICD-10-CM | POA: Diagnosis not present

## 2023-05-02 DIAGNOSIS — M19072 Primary osteoarthritis, left ankle and foot: Secondary | ICD-10-CM | POA: Diagnosis not present

## 2023-05-02 DIAGNOSIS — M2141 Flat foot [pes planus] (acquired), right foot: Secondary | ICD-10-CM | POA: Diagnosis not present

## 2023-05-02 DIAGNOSIS — M76822 Posterior tibial tendinitis, left leg: Secondary | ICD-10-CM | POA: Diagnosis not present

## 2023-05-20 DIAGNOSIS — M24572 Contracture, left ankle: Secondary | ICD-10-CM | POA: Diagnosis not present

## 2023-05-20 DIAGNOSIS — M21071 Valgus deformity, not elsewhere classified, right ankle: Secondary | ICD-10-CM | POA: Diagnosis not present

## 2023-05-20 DIAGNOSIS — M25872 Other specified joint disorders, left ankle and foot: Secondary | ICD-10-CM | POA: Diagnosis not present

## 2023-05-20 DIAGNOSIS — M722 Plantar fascial fibromatosis: Secondary | ICD-10-CM | POA: Diagnosis not present

## 2023-05-20 DIAGNOSIS — M19072 Primary osteoarthritis, left ankle and foot: Secondary | ICD-10-CM | POA: Diagnosis not present

## 2023-05-28 DIAGNOSIS — Z7901 Long term (current) use of anticoagulants: Secondary | ICD-10-CM | POA: Diagnosis not present

## 2023-05-28 DIAGNOSIS — I82431 Acute embolism and thrombosis of right popliteal vein: Secondary | ICD-10-CM | POA: Diagnosis not present

## 2023-06-27 DIAGNOSIS — I82431 Acute embolism and thrombosis of right popliteal vein: Secondary | ICD-10-CM | POA: Diagnosis not present

## 2023-06-27 DIAGNOSIS — Z7901 Long term (current) use of anticoagulants: Secondary | ICD-10-CM | POA: Diagnosis not present

## 2023-07-03 NOTE — Progress Notes (Signed)
[facility] Cancer Initial Visit:  Patient Care Team: Garlan Fillers, MD as PCP - General (Internal Medicine)  CHIEF COMPLAINTS/PURPOSE OF CONSULTATION:   HISTORY OF PRESENTING ILLNESS: Anthony Nauss Sr. 82 y.o. male is here because of  DVT Medical history notable for prostate cancer treated with seed implants, diabetes mellitus type 2, glaucoma, hyperlipidemia, hypertension, stroke, tubular adenoma of colon  May 02, 2022: Colonoscopy demonstrated 6 sessile polyps in transverse and ascending colon 5 to 8 mm in size  February 07, 2023: Right lower extremity ultrasound to evaluate edema and discomfort of 5 days duration.  This demonstrated acute thrombus in the popliteal and peroneal veins and age-indeterminate thrombus in posterior tibial and superficial saphenous veins  March 08, 2023: WBC 6.0 hemoglobin 13.5 platelet count 221 CMP notable for glucose of 108  March 01 2023: [facility] Hematology Consult Patient was doing well prior to the diagnosis of the DVT.  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  States that he woke up one morning and noted swelling in the right calf.  No pain.  No chest pain, SOB, cough.    Patient was placed on Lovenox then switched to coumadin.  His total weekly dose of coumadin is 30 mg which is administered as 5 mg on 5 days a week and 2.5 mg on 2 days a week.  This is being managed by PCP.  No bleeding problems on coumadin.  He is not using testosterone supplements  Social:  Retired.  Former short Production assistant, radio.  Tobacco none.  EtOH none  WBC 5.7 hemoglobin 14.4 platelet count 246; 53 segs 36 lymphs 9 monos 1 eosinophil 1 basophil. Anticardiolipin antibody negative antibeta-2 glycoprotein negative.  D-dimer 1.85 INR 3.0 PTT 33 PSA undetectable Chemistries notable for alk phos 152  April 05, 2023: Telephone visit regarding history of DVT.  July 05 2023:  Scheduled follow up for DVT.  No bleeding problems.  On Coumadin 5 mg  on 3 days a week and 2.5 mg on 4 days a week which is being managed by PCP.  No chest pain, SOB, leg swelling.  Still working out.  Six colon polyps recently removed.  Patient to see PCP in September at which time he states he will undergo a follow up LE U/S to evaluate if has residual clot.    There is an interaction between Eliquis and Dilantin which results in decreased Eliquis levels.  Can consider prophylactic anticoagulation with Eliquis 5 mg po bid.  Usual prophylactic dose would be Eliquis 2.5 mg daily but because of dilantin this would not likely be effective. No longer on ASA 325 mg  Hgb 13.4 PLT 207  Cr 0.85 Glu 131  D dimer 1.3 INR 2.6  Review of Systems  Constitutional:  Negative for appetite change, chills, fatigue, fever and unexpected weight change.  HENT:   Negative for hearing loss, lump/mass, mouth sores, nosebleeds, sore throat, tinnitus, trouble swallowing and voice change.   Eyes:  Negative for eye problems and icterus.       Vision changes:  None  Respiratory:  Negative for chest tightness, cough, hemoptysis, shortness of breath and wheezing.        PND:  none Orthopnea:  none DOE:    Cardiovascular:  Negative for chest pain, leg swelling and palpitations.       PND:  none Orthopnea:  none  Gastrointestinal:  Negative for abdominal pain, blood in stool, constipation, diarrhea, nausea and vomiting.  Endocrine: Negative for hot flashes.       Cold intolerance:  none Heat intolerance:  none  Genitourinary:  Positive for nocturia. Negative for bladder incontinence, difficulty urinating, dysuria, frequency and hematuria.   Musculoskeletal:  Negative for back pain, gait problem, myalgias, neck pain and neck stiffness.       Mild arthralgias and myalgias  Skin:  Negative for itching, rash and wound.  Neurological:  Negative for dizziness, extremity weakness, gait problem, headaches, light-headedness, numbness, seizures and speech difficulty.  Hematological:  Negative for  adenopathy. Does not bruise/bleed easily.  Psychiatric/Behavioral:  Negative for sleep disturbance and suicidal ideas. The patient is not nervous/anxious.     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   Arthritis    Diabetes mellitus without complication (HCC)    Glaucoma    Hyperlipidemia    Hypertension    Seizures (HCC)    Stroke (HCC)    TIA (transient ischemic attack)    Tubular adenoma of colon 10/2010    SURGICAL HISTORY: Past Surgical History:  Procedure Laterality Date   COLONOSCOPY     DENTAL SURGERY     implants   LEFT EYE SURGERY     MENISCUS REPAIR Right    POLYPECTOMY     PROSTATE BIOPSY     RADIOACTIVE SEED IMPLANT N/A 04/27/2019   Procedure: RADIOACTIVE SEED IMPLANT/BRACHYTHERAPY IMPLANT;  Surgeon: Marcine Matar, MD;  Location: [facility];  Service: Urology;  Laterality: N/A;   SPACE OAR INSTILLATION N/A 04/27/2019   Procedure: SPACE OAR INSTILLATION;  Surgeon: Marcine Matar, MD;  Location: [facility];  Service: Urology;  Laterality: N/A;   TONSILLECTOMY     VASECTOMY      SOCIAL HISTORY: Social History   Socioeconomic History   Marital status: Divorced    Spouse name: Not on file   Number of children: 4   Years of education: Not on file   Highest education level: Not on file  Occupational History   Occupation: retired    Comment: truck driver  Tobacco Use   Smoking status: Former    Current packs/day: 0.00    Types: Cigars, Cigarettes    Quit date: 03/12/1991    Years since quitting: 32.3   Smokeless tobacco: Never  Vaping Use   Vaping status: Never Used  Substance and Sexual Activity   Alcohol use: No    Alcohol/week: 0.0 standard drinks of alcohol   Drug use: No   Sexual activity: Yes  Other Topics Concern   Not on file  Social History Narrative   Not on file   Social Determinants of Health   Financial Resource Strain: Low Risk  (03/01/2023)   Overall Financial Resource Strain  (CARDIA)    Difficulty of Paying Living Expenses: Not hard at all  Food Insecurity: No Food Insecurity (03/01/2023)   Hunger Vital Sign    Worried About Running Out of Food in the Last Year: Never true    Ran Out of Food in the Last Year: Never true  Transportation Needs: No Transportation Needs (03/01/2023)   PRAPARE - Administrator, Civil Service (Medical): No    Lack of Transportation (Non-Medical): No  Physical Activity: Not on file  Stress: Not on file  Social Connections: Not on file  Intimate Partner Violence: Not At Risk (03/01/2023)   Humiliation, Afraid, Rape, and Kick questionnaire    Fear of Current or Ex-Partner: No    Emotionally Abused:  No    Physically Abused: No    Sexually Abused: No    FAMILY HISTORY Family History  Problem Relation Age of Onset   Heart failure Mother    Diabetes Mother    Heart attack Father    Prostate cancer Brother    Stroke Granddaughter    Colon cancer Neg Hx    Stomach cancer Neg Hx    Esophageal cancer Neg Hx    Rectal cancer Neg Hx     ALLERGIES:  is allergic to clindamycin/lincomycin, grapefruit concentrate, ace inhibitors, and ciprofloxacin.  MEDICATIONS:  Current Outpatient Medications  Medication Sig Dispense Refill   bimatoprost (LUMIGAN) 0.01 % SOLN Place 1 drop into both eyes at bedtime.      cloNIDine (CATAPRES) 0.2 MG tablet Take 0.2 mg by mouth 3 (three) times daily.     dorzolamide-timolol (COSOPT) 22.3-6.8 MG/ML ophthalmic solution INSTILL 1 DROP INTO EACH EYE TWICE DAILY Ophthalmic for 38 Days     gabapentin (NEURONTIN) 300 MG capsule Take 300 mg by mouth 2 (two) times daily as needed (neuropathy).      metFORMIN (GLUCOPHAGE-XR) 500 MG 24 hr tablet Take 500 mg by mouth daily with supper.     metoprolol succinate (TOPROL-XL) 50 MG 24 hr tablet Take 1 tablet by mouth 2 (two) times daily.     phenytoin (DILANTIN) 100 MG ER capsule Take 100 mg by mouth 2 (two) times daily.      rosuvastatin (CRESTOR) 5 MG  tablet Take 5 mg by mouth daily.     tamsulosin (FLOMAX) 0.4 MG CAPS capsule Take 0.4 mg by mouth 2 (two) times daily.     triamterene-hydrochlorothiazide (MAXZIDE-25) 37.5-25 MG tablet Take 1 tablet by mouth every morning.     verapamil (CALAN-SR) 240 MG CR tablet Take 240 mg by mouth daily.      warfarin (COUMADIN) 5 MG tablet Take 1 tablet (5 mg total) by mouth daily. Or as instructed by doctor's office. 30 tablet 0   No current facility-administered medications for this visit.    PHYSICAL EXAMINATION:  ECOG PERFORMANCE STATUS: 0 - Asymptomatic   Vitals:   07/05/23 1114 07/05/23 1151  BP: (!) 174/81 (!) 171/85  Pulse: 60   Resp: 16   Temp: 98.1 F (36.7 C)   SpO2: 100%      Filed Weights   07/05/23 1114  Weight: 192 lb 1.6 oz (87.1 kg)      Physical Exam Vitals and nursing note reviewed.  Constitutional:      Appearance: Normal appearance. He is not diaphoretic.  HENT:     Head: Normocephalic and atraumatic.     Right Ear: External ear normal.     Left Ear: External ear normal.     Nose: Nose normal.  Eyes:     Conjunctiva/sclera: Conjunctivae normal.     Pupils: Pupils are equal, round, and reactive to light.  Cardiovascular:     Rate and Rhythm: Normal rate and regular rhythm.     Heart sounds:     No friction rub. No gallop.  Pulmonary:     Effort: Pulmonary effort is normal. No respiratory distress.     Breath sounds: No stridor. No wheezing.  Abdominal:     Palpations: Abdomen is soft.     Tenderness: There is no abdominal tenderness. There is no guarding or rebound.  Musculoskeletal:        General: No tenderness, deformity or signs of injury. Normal range of motion.  Cervical back: Normal range of motion and neck supple. No rigidity or tenderness.  Lymphadenopathy:     Head:     Right side of head: No submental, submandibular, tonsillar, preauricular, posterior auricular or occipital adenopathy.     Left side of head: No submental,  submandibular, tonsillar, preauricular, posterior auricular or occipital adenopathy.     Cervical: No cervical adenopathy.     Right cervical: No superficial, deep or posterior cervical adenopathy.    Left cervical: No superficial, deep or posterior cervical adenopathy.     Upper Body:     Right upper body: No supraclavicular or axillary adenopathy.     Left upper body: No supraclavicular or axillary adenopathy.     Lower Body: No right inguinal adenopathy. No left inguinal adenopathy.  Skin:    Coloration: Skin is not jaundiced.  Neurological:     General: No focal deficit present.     Mental Status: He is alert and oriented to person, place, and time.     Cranial Nerves: No cranial nerve deficit.  Psychiatric:        Mood and Affect: Mood normal.        Behavior: Behavior normal.        Thought Content: Thought content normal.        Judgment: Judgment normal.     LABORATORY DATA: I have personally reviewed the data as listed:  Appointment on 07/05/2023  Component Date Value Ref Range Status   D-Dimer, Quant 07/05/2023 1.30 (H)  0.00 - 0.50 ug/mL-FEU Final   Comment: (NOTE) At the manufacturer cut-off value of 0.5 g/mL FEU, this assay has a negative predictive value of 95-100%.This assay is intended for use in conjunction with a clinical pretest probability (PTP) assessment model to exclude pulmonary embolism (PE) and deep venous thrombosis (DVT) in outpatients suspected of PE or DVT. Results should be correlated with clinical presentation. Performed at [facility], [address] [place], [place] [address]    Prothrombin Time 07/05/2023 27.8 (H)  11.4 - 15.2 seconds Final   INR 07/05/2023 2.6 (H)  0.8 - 1.2 Final   Comment: (NOTE) INR goal varies based on device and disease states. Performed at [facility], [address] [place], [place] [address]    Sodium 07/05/2023 137  135 - 145 mmol/L Final   Potassium 07/05/2023 4.0   3.5 - 5.1 mmol/L Final   Chloride 07/05/2023 101  98 - 111 mmol/L Final   CO2 07/05/2023 29  22 - 32 mmol/L Final   Glucose, Bld 07/05/2023 99  70 - 99 mg/dL Final   Glucose reference range applies only to samples taken after fasting for at least 8 hours.   BUN 07/05/2023 7 (L)  8 - 23 mg/dL Final   Creatinine 09/81/1914 0.85  0.61 - 1.24 mg/dL Final   Calcium 78/29/5621 9.6  8.9 - 10.3 mg/dL Final   Total Protein 30/86/5784 6.9  6.5 - 8.1 g/dL Final   Albumin 69/62/9528 3.9  3.5 - 5.0 g/dL Final   AST 41/32/4401 13 (L)  15 - 41 U/L Final   ALT 07/05/2023 18  0 - 44 U/L Final   Alkaline Phosphatase 07/05/2023 131 (H)  38 - 126 U/L Final   Total Bilirubin 07/05/2023 0.3  0.3 - 1.2 mg/dL Final   GFR, Estimated 07/05/2023 >60  >60 mL/min Final   Comment: (NOTE) Calculated using the CKD-EPI Creatinine Equation (2021)    Anion gap 07/05/2023 7  5 -  15 Final   Performed at [facility], [address], [place], [place] [address]   WBC Count 07/05/2023 5.3  4.0 - 10.5 K/uL Final   RBC 07/05/2023 4.65  4.22 - 5.81 MIL/uL Final   Hemoglobin 07/05/2023 13.4  13.0 - 17.0 g/dL Final   HCT 60/45/4098 41.8  39.0 - 52.0 % Final   MCV 07/05/2023 89.9  80.0 - 100.0 fL Final   MCH 07/05/2023 28.8  26.0 - 34.0 pg Final   MCHC 07/05/2023 32.1  30.0 - 36.0 g/dL Final   RDW 11/91/4782 13.4  11.5 - 15.5 % Final   Platelet Count 07/05/2023 207  150 - 400 K/uL Final   nRBC 07/05/2023 0.0  0.0 - 0.2 % Final   Neutrophils Relative % 07/05/2023 55  % Final   Neutro Abs 07/05/2023 2.9  1.7 - 7.7 K/uL Final   Lymphocytes Relative 07/05/2023 35  % Final   Lymphs Abs 07/05/2023 1.9  0.7 - 4.0 K/uL Final   Monocytes Relative 07/05/2023 9  % Final   Monocytes Absolute 07/05/2023 0.5  0.1 - 1.0 K/uL Final   Eosinophils Relative 07/05/2023 0  % Final   Eosinophils Absolute 07/05/2023 0.0  0.0 - 0.5 K/uL Final   Basophils Relative 07/05/2023 1  % Final   Basophils Absolute 07/05/2023 0.0   0.0 - 0.1 K/uL Final   Immature Granulocytes 07/05/2023 0  % Final   Abs Immature Granulocytes 07/05/2023 0.01  0.00 - 0.07 K/uL Final   Performed at [facility], [address], [place], [place] [address]    RADIOGRAPHIC STUDIES: I have personally reviewed the radiological images as listed and agree with the findings in the report  No results found.  ASSESSMENT/PLAN  82 y.o. male is here because of  DVT.  Medical history notable for prostate cancer treated with seed implants, diabetes mellitus type 2, glaucoma, hyperlipidemia, hypertension, stroke, tubular adenoma of colon  RLE DVT February 07 2023- Presented with 5 days history of RLE swelling and discomfort.  Doppler U/S demonstrated acute thrombus in the popliteal and peroneal veins and age-indeterminate thrombus in posterior tibial and superficial saphenous veins   Patient was placed on Coumadin which is being managed by PCP.  Recommend 6 months of anticoagulation  July 05 2023- On Coumadin 5 mg on 3 days a week and 2.5 mg on 4 days a week which is being managed by PCP.   Today Hgb 13.4 PLT 207  Cr 0.85 Glu 131  D dimer 1.3 INR 2.6.  Anticoagulation adequate.  To undergo follow up LE U/S in September; ordered by PCP.  Discussed prophylactic anticoagulation with Eliquis; usual dose would be 2.5 mg bid but because of dilantin interaction would consider Eliquis 5 mg bid   VTE risk factors:  This appears to have been an unprovoked event.  Patient has no discernable risk factors except for age.     March 01 2023- Anticardiolipin antibody negative antibeta-2 glycoprotein negative.  D-dimer 1.85 INR 3.0 PTT 33   History of prostate cancer:  Treated with radioactive seeds  March 01 2023:  PSA < 0.1  History of prostate cancer and colon polyps  March 01 2023- Recommended consideration for genetic counselling  April 03 2023- No show for appointment  July 05 2023- Several polyps removed during recent colonoscopy    Cancer Staging  No matching staging information was found for the patient.    No problem-specific Assessment & Plan notes found  for this encounter.    No orders of the defined types were placed in this encounter.   30  minutes was spent in patient care.  This included time spent preparing to see the patient (e.g., review of tests), obtaining and/or reviewing separately obtained history, counseling and educating the patient/family/caregiver, ordering medications, tests, or procedures; documenting clinical information in the electronic or other health record, independently interpreting results and communicating results to the patient/family/caregiver as well as coordination of care.       All questions were answered. The patient knows to call the clinic with any problems, questions or concerns.  This note was electronically signed.    Loni Muse, MD  07/19/2023 12:48 PM

## 2023-07-04 ENCOUNTER — Other Ambulatory Visit: Payer: Self-pay | Admitting: Oncology

## 2023-07-04 DIAGNOSIS — I82431 Acute embolism and thrombosis of right popliteal vein: Secondary | ICD-10-CM

## 2023-07-05 ENCOUNTER — Other Ambulatory Visit: Payer: Self-pay

## 2023-07-05 ENCOUNTER — Inpatient Hospital Stay: Payer: Medicare HMO | Admitting: Oncology

## 2023-07-05 ENCOUNTER — Telehealth: Payer: Self-pay | Admitting: Oncology

## 2023-07-05 ENCOUNTER — Inpatient Hospital Stay: Payer: Medicare HMO | Attending: Oncology

## 2023-07-05 VITALS — BP 171/85 | HR 60 | Temp 98.1°F | Resp 16 | Wt 192.1 lb

## 2023-07-05 DIAGNOSIS — Z833 Family history of diabetes mellitus: Secondary | ICD-10-CM | POA: Diagnosis not present

## 2023-07-05 DIAGNOSIS — C61 Malignant neoplasm of prostate: Secondary | ICD-10-CM | POA: Diagnosis not present

## 2023-07-05 DIAGNOSIS — Z8719 Personal history of other diseases of the digestive system: Secondary | ICD-10-CM | POA: Insufficient documentation

## 2023-07-05 DIAGNOSIS — Z881 Allergy status to other antibiotic agents status: Secondary | ICD-10-CM | POA: Diagnosis not present

## 2023-07-05 DIAGNOSIS — Z8249 Family history of ischemic heart disease and other diseases of the circulatory system: Secondary | ICD-10-CM | POA: Diagnosis not present

## 2023-07-05 DIAGNOSIS — M7989 Other specified soft tissue disorders: Secondary | ICD-10-CM | POA: Insufficient documentation

## 2023-07-05 DIAGNOSIS — Z8673 Personal history of transient ischemic attack (TIA), and cerebral infarction without residual deficits: Secondary | ICD-10-CM | POA: Insufficient documentation

## 2023-07-05 DIAGNOSIS — Z86010 Personal history of colonic polyps: Secondary | ICD-10-CM | POA: Insufficient documentation

## 2023-07-05 DIAGNOSIS — H409 Unspecified glaucoma: Secondary | ICD-10-CM | POA: Insufficient documentation

## 2023-07-05 DIAGNOSIS — E119 Type 2 diabetes mellitus without complications: Secondary | ICD-10-CM | POA: Diagnosis not present

## 2023-07-05 DIAGNOSIS — D126 Benign neoplasm of colon, unspecified: Secondary | ICD-10-CM | POA: Diagnosis not present

## 2023-07-05 DIAGNOSIS — E785 Hyperlipidemia, unspecified: Secondary | ICD-10-CM | POA: Insufficient documentation

## 2023-07-05 DIAGNOSIS — Z7901 Long term (current) use of anticoagulants: Secondary | ICD-10-CM

## 2023-07-05 DIAGNOSIS — I10 Essential (primary) hypertension: Secondary | ICD-10-CM | POA: Insufficient documentation

## 2023-07-05 DIAGNOSIS — Z823 Family history of stroke: Secondary | ICD-10-CM | POA: Diagnosis not present

## 2023-07-05 DIAGNOSIS — Z79899 Other long term (current) drug therapy: Secondary | ICD-10-CM | POA: Diagnosis not present

## 2023-07-05 DIAGNOSIS — Z9089 Acquired absence of other organs: Secondary | ICD-10-CM | POA: Diagnosis not present

## 2023-07-05 DIAGNOSIS — Z5181 Encounter for therapeutic drug level monitoring: Secondary | ICD-10-CM | POA: Diagnosis not present

## 2023-07-05 DIAGNOSIS — Z86718 Personal history of other venous thrombosis and embolism: Secondary | ICD-10-CM | POA: Insufficient documentation

## 2023-07-05 DIAGNOSIS — Z8042 Family history of malignant neoplasm of prostate: Secondary | ICD-10-CM | POA: Diagnosis not present

## 2023-07-05 DIAGNOSIS — R351 Nocturia: Secondary | ICD-10-CM | POA: Insufficient documentation

## 2023-07-05 DIAGNOSIS — Z87891 Personal history of nicotine dependence: Secondary | ICD-10-CM | POA: Diagnosis not present

## 2023-07-05 DIAGNOSIS — I82431 Acute embolism and thrombosis of right popliteal vein: Secondary | ICD-10-CM

## 2023-07-05 LAB — PROTIME-INR
INR: 2.6 — ABNORMAL HIGH (ref 0.8–1.2)
Prothrombin Time: 27.8 seconds — ABNORMAL HIGH (ref 11.4–15.2)

## 2023-07-05 LAB — CBC WITH DIFFERENTIAL (CANCER CENTER ONLY)
Abs Immature Granulocytes: 0.01 10*3/uL (ref 0.00–0.07)
Basophils Absolute: 0 10*3/uL (ref 0.0–0.1)
Basophils Relative: 1 %
Eosinophils Absolute: 0 10*3/uL (ref 0.0–0.5)
Eosinophils Relative: 0 %
HCT: 41.8 % (ref 39.0–52.0)
Hemoglobin: 13.4 g/dL (ref 13.0–17.0)
Immature Granulocytes: 0 %
Lymphocytes Relative: 35 %
Lymphs Abs: 1.9 10*3/uL (ref 0.7–4.0)
MCH: 28.8 pg (ref 26.0–34.0)
MCHC: 32.1 g/dL (ref 30.0–36.0)
MCV: 89.9 fL (ref 80.0–100.0)
Monocytes Absolute: 0.5 10*3/uL (ref 0.1–1.0)
Monocytes Relative: 9 %
Neutro Abs: 2.9 10*3/uL (ref 1.7–7.7)
Neutrophils Relative %: 55 %
Platelet Count: 207 10*3/uL (ref 150–400)
RBC: 4.65 MIL/uL (ref 4.22–5.81)
RDW: 13.4 % (ref 11.5–15.5)
WBC Count: 5.3 10*3/uL (ref 4.0–10.5)
nRBC: 0 % (ref 0.0–0.2)

## 2023-07-05 LAB — CMP (CANCER CENTER ONLY)
ALT: 18 U/L (ref 0–44)
AST: 13 U/L — ABNORMAL LOW (ref 15–41)
Albumin: 3.9 g/dL (ref 3.5–5.0)
Alkaline Phosphatase: 131 U/L — ABNORMAL HIGH (ref 38–126)
Anion gap: 7 (ref 5–15)
BUN: 7 mg/dL — ABNORMAL LOW (ref 8–23)
CO2: 29 mmol/L (ref 22–32)
Calcium: 9.6 mg/dL (ref 8.9–10.3)
Chloride: 101 mmol/L (ref 98–111)
Creatinine: 0.85 mg/dL (ref 0.61–1.24)
GFR, Estimated: >60 mL/min (ref >60)
Glucose, Bld: 99 mg/dL (ref 70–99)
Potassium: 4 mmol/L (ref 3.5–5.1)
Sodium: 137 mmol/L (ref 135–145)
Total Bilirubin: 0.3 mg/dL (ref 0.3–1.2)
Total Protein: 6.9 g/dL (ref 6.5–8.1)

## 2023-07-05 LAB — D-DIMER, QUANTITATIVE: D-Dimer, Quant: 1.3 ug/mL-FEU — ABNORMAL HIGH (ref 0.00–0.50)

## 2023-07-05 NOTE — Progress Notes (Signed)
Patient informed of elevated BP readings today, he states he has taken his BP medication, he checks his BP at home, & that his BP is always elevated when he goes to MD appointments.  Instructed patient to contact his PCP if his BP is elevated at home, he verbalizes understanding.

## 2023-07-05 NOTE — Telephone Encounter (Signed)
Patient is aware of upcoming appointment times/dates.  

## 2023-07-19 DIAGNOSIS — Z7901 Long term (current) use of anticoagulants: Secondary | ICD-10-CM | POA: Insufficient documentation

## 2023-07-30 DIAGNOSIS — I10 Essential (primary) hypertension: Secondary | ICD-10-CM | POA: Diagnosis not present

## 2023-07-30 DIAGNOSIS — H6123 Impacted cerumen, bilateral: Secondary | ICD-10-CM | POA: Diagnosis not present

## 2023-07-30 DIAGNOSIS — Z7901 Long term (current) use of anticoagulants: Secondary | ICD-10-CM | POA: Diagnosis not present

## 2023-07-30 DIAGNOSIS — I82431 Acute embolism and thrombosis of right popliteal vein: Secondary | ICD-10-CM | POA: Diagnosis not present

## 2023-08-29 ENCOUNTER — Other Ambulatory Visit (HOSPITAL_COMMUNITY): Payer: Self-pay

## 2023-09-03 DIAGNOSIS — Z7901 Long term (current) use of anticoagulants: Secondary | ICD-10-CM | POA: Diagnosis not present

## 2023-09-03 DIAGNOSIS — I82501 Chronic embolism and thrombosis of unspecified deep veins of right lower extremity: Secondary | ICD-10-CM | POA: Diagnosis not present

## 2023-09-12 DIAGNOSIS — H401132 Primary open-angle glaucoma, bilateral, moderate stage: Secondary | ICD-10-CM | POA: Diagnosis not present

## 2023-10-03 DIAGNOSIS — Z7901 Long term (current) use of anticoagulants: Secondary | ICD-10-CM | POA: Diagnosis not present

## 2023-10-03 DIAGNOSIS — I82501 Chronic embolism and thrombosis of unspecified deep veins of right lower extremity: Secondary | ICD-10-CM | POA: Diagnosis not present

## 2023-10-30 DIAGNOSIS — Z7901 Long term (current) use of anticoagulants: Secondary | ICD-10-CM | POA: Diagnosis not present

## 2023-10-30 DIAGNOSIS — I82501 Chronic embolism and thrombosis of unspecified deep veins of right lower extremity: Secondary | ICD-10-CM | POA: Diagnosis not present

## 2023-11-27 DIAGNOSIS — Z7901 Long term (current) use of anticoagulants: Secondary | ICD-10-CM | POA: Diagnosis not present

## 2023-11-27 DIAGNOSIS — I82501 Chronic embolism and thrombosis of unspecified deep veins of right lower extremity: Secondary | ICD-10-CM | POA: Diagnosis not present

## 2023-11-27 DIAGNOSIS — J329 Chronic sinusitis, unspecified: Secondary | ICD-10-CM | POA: Diagnosis not present

## 2023-12-20 DIAGNOSIS — M79671 Pain in right foot: Secondary | ICD-10-CM | POA: Diagnosis not present

## 2023-12-20 DIAGNOSIS — M67962 Unspecified disorder of synovium and tendon, left lower leg: Secondary | ICD-10-CM | POA: Diagnosis not present

## 2023-12-20 DIAGNOSIS — M67961 Unspecified disorder of synovium and tendon, right lower leg: Secondary | ICD-10-CM | POA: Diagnosis not present

## 2023-12-20 DIAGNOSIS — M25571 Pain in right ankle and joints of right foot: Secondary | ICD-10-CM | POA: Diagnosis not present

## 2023-12-20 DIAGNOSIS — L84 Corns and callosities: Secondary | ICD-10-CM | POA: Diagnosis not present

## 2023-12-20 DIAGNOSIS — M25572 Pain in left ankle and joints of left foot: Secondary | ICD-10-CM | POA: Diagnosis not present

## 2023-12-20 DIAGNOSIS — M79672 Pain in left foot: Secondary | ICD-10-CM | POA: Diagnosis not present

## 2023-12-25 DIAGNOSIS — I82501 Chronic embolism and thrombosis of unspecified deep veins of right lower extremity: Secondary | ICD-10-CM | POA: Diagnosis not present

## 2023-12-25 DIAGNOSIS — Z7901 Long term (current) use of anticoagulants: Secondary | ICD-10-CM | POA: Diagnosis not present

## 2023-12-30 DIAGNOSIS — E785 Hyperlipidemia, unspecified: Secondary | ICD-10-CM | POA: Diagnosis not present

## 2023-12-30 DIAGNOSIS — I10 Essential (primary) hypertension: Secondary | ICD-10-CM | POA: Diagnosis not present

## 2023-12-30 DIAGNOSIS — E1129 Type 2 diabetes mellitus with other diabetic kidney complication: Secondary | ICD-10-CM | POA: Diagnosis not present

## 2023-12-30 DIAGNOSIS — E1121 Type 2 diabetes mellitus with diabetic nephropathy: Secondary | ICD-10-CM | POA: Diagnosis not present

## 2023-12-30 DIAGNOSIS — Z8546 Personal history of malignant neoplasm of prostate: Secondary | ICD-10-CM | POA: Diagnosis not present

## 2024-01-02 ENCOUNTER — Other Ambulatory Visit: Payer: Self-pay

## 2024-01-02 DIAGNOSIS — I82431 Acute embolism and thrombosis of right popliteal vein: Secondary | ICD-10-CM

## 2024-01-02 NOTE — Progress Notes (Signed)
[facility] CONSULT NOTE  Patient Care Team: Garlan Fillers, MD as PCP - General (Internal Medicine)   ASSESSMENT & PLAN:  83 y.o.male with history of prostate cancer treated with seed implants, diabetes mellitus type 2, glaucoma, hyperlipidemia, hypertension, stroke, tubular adenoma of colon and DVT referred to [facility] for history of RLE DVT.  Last DVT was seen in 01/2023.  First episode: 2024.  Setting: unprovoked  Risk factors: Unprovoked, personal history of VTE.  Family history of VTE.  Age, male, developed DVT while on aspirin. Treatment: warfarin alternating 2.5 mg and 5 mg every other day.  We discussed risks and benefit of anticoagulation today.  We discussed warfarin has long half-life.  Given this is the first episode, may consider stopping.  If discontinuing anticoagulation, he will need to monitor signs and symptoms of DVT and PE closely and report to ED immediately.  Sometimes life-threatening PE can occur.  He does have risk factors of recurrent VTE.  If continuing anticoagulation, in the setting of trauma, uncontrolled bleeding it can be life-threatening.  He does not have recurrent falling, dizziness, and is not frail. He appears to be tolerating well.  No signs of bleeding.  After discussion, I think it reasonable to continue anticoagulation.  Patient may continue to follow-up about every 6 months, sooner if concerning symptoms.  Patient education for risk factors and prevention of clotting We talked about modifiable risk factors.  Prevention of clotting like deep vein thrombosis including: Strong risk factors including fractures of lower limb, hospitalization for severe illness, such as heart failure, myocardial infarction, spinal cord injury, major trauma, hip or knee replacement, and previous VTE. avoid prolonged immobilization and moving extremities every 1-2 hours during long car rides or flights.  Taking a break and  moving extremities if working in a job setting with prolonged sitting.   Avoid dehydration, especially in high altitude. Avoid cigarette smoking Avoid use of hormone replacement therapy, estrogen-containing contraceptive in women.   Maintaining healthy lifestyle to prevent development of diabetes.  Weight loss if BMI over 30.  Regular exercises but not extreme.   Other risk factors for clotting are surgery, hospitalization, inflammatory disease or severe infection, trauma or injuries from inflammatory state and stasis. If developing one-sided leg swelling, pain, color change, chest pain, sudden short of breath, difficulty taking deep breaths, taking deep breath with chest discomfort or pain, dizziness or heart racing sensation, go to the emergency room immediately for evaluation. If developing trauma, uncontrolled bleeding, such as bloody stools report ED immediately. Avoid NSAIDs, aspirin while on blood thinner.  Follow up in about 6 months.  Anthony Sartorius, MD 1/24/202512:18 PM  CHIEF COMPLAINTS/PURPOSE OF CONSULTATION:  DVT  HISTORY OF PRESENTING ILLNESS:  Anthony Mulligan Sr. 83 y.o. male is here because of history of right lower extremity DVT.  He has been active most of his life and still do push ups. He can 50 at a time. No smoking, drinking.  In Feb 2024. He got up one morning and noted right calf is larger than left. He saw his PCP and [place] was done. Report apixaban interferes with dilantin so was placed on warfarin. He has been tolerating well. He feels the right leg is back to normal like the left. No swelling or pain.  No injury, trauma, surgery or long flight or car ride before the event. No testosterone.  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 He is seeing Urology.  He otherwise feels well. No bone or back pain. Appetite is good.  He saw PCP Dr. Hyacinth Meeker last week and INR was 2.5. He sees them  about every month. No bloody stool or dark stool or bleeding.   01/18/23 [place]: Summary:  RIGHT:  - Findings consistent with acute and occlusive deep vein thrombosis involving the proximal popliteal vein and peroneal veins.   - Partially occlusive and age-indeterminate thrombosis involving the mid and distal popliteal vein, tibioperoneal trunk, 1 paired posterior tibial vein.  - Findings consistent with acute superficial vein thrombosis involving the right small saphenous vein   Patient denies history of DVT prior to age 21. Report his oldest daughter DVT after long car drive.    MEDICAL HISTORY:  Past Medical History:  Diagnosis Date   Adenocarcinoma of prostate (HCC) 10/14   Arthritis    Diabetes mellitus without complication (HCC)    Glaucoma    Hyperlipidemia    Hypertension    Seizures (HCC)    Stroke (HCC)    TIA (transient ischemic attack)    Tubular adenoma of colon 10/2010    SURGICAL HISTORY: Past Surgical History:  Procedure Laterality Date   COLONOSCOPY     DENTAL SURGERY     implants   LEFT EYE SURGERY     MENISCUS REPAIR Right    POLYPECTOMY     PROSTATE BIOPSY     RADIOACTIVE SEED IMPLANT N/A 04/27/2019   Procedure: RADIOACTIVE SEED IMPLANT/BRACHYTHERAPY IMPLANT;  Surgeon: Marcine Matar, MD;  Location: [facility];  Service: Urology;  Laterality: N/A;   SPACE OAR INSTILLATION N/A 04/27/2019   Procedure: SPACE OAR INSTILLATION;  Surgeon: Marcine Matar, MD;  Location: [facility];  Service: Urology;  Laterality: N/A;   TONSILLECTOMY     VASECTOMY      SOCIAL HISTORY: Social History   Socioeconomic History   Marital status: Divorced    Spouse name: Not on file   Number of children: 4   Years of education: Not on file   Highest education level: Not on file  Occupational History   Occupation: retired    Comment: truck driver  Tobacco Use   Smoking status: Former    Current packs/day: 0.00    Types: Cigars, Cigarettes    Quit date: 03/12/1991    Years  since quitting: 32.8   Smokeless tobacco: Never  Vaping Use   Vaping status: Never Used  Substance and Sexual Activity   Alcohol use: No    Alcohol/week: 0.0 standard drinks of alcohol   Drug use: No   Sexual activity: Yes  Other Topics Concern   Not on file  Social History Narrative   Not on file   Social Drivers of Health   Financial Resource Strain: Low Risk  (03/01/2023)   Overall Financial Resource Strain (CARDIA)    Difficulty of Paying Living Expenses: Not hard at all  Food Insecurity: No Food Insecurity (03/01/2023)   Hunger Vital Sign    Worried About Running Out of Food in the Last Year: Never true    Ran Out of Food in the Last Year: Never true  Transportation Needs: No Transportation Needs (03/01/2023)   PRAPARE - Administrator, Civil Service (Medical): No    Lack of Transportation (Non-Medical): No  Physical Activity: Not on file  Stress: Not on file  Social Connections: Not on file  Intimate Partner Violence: Not At Risk (03/01/2023)  Humiliation, Afraid, Rape, and Kick questionnaire    Fear of Current or Ex-Partner: No    Emotionally Abused: No    Physically Abused: No    Sexually Abused: No    FAMILY HISTORY: Family History  Problem Relation Age of Onset   Heart failure Mother    Diabetes Mother    Heart attack Father    Prostate cancer Brother    Stroke Granddaughter    Colon cancer Neg Hx    Stomach cancer Neg Hx    Esophageal cancer Neg Hx    Rectal cancer Neg Hx     ALLERGIES:  is allergic to clindamycin/lincomycin, grapefruit concentrate, ace inhibitors, and ciprofloxacin.  MEDICATIONS:  Current Outpatient Medications  Medication Sig Dispense Refill   bimatoprost (LUMIGAN) 0.01 % SOLN Place 1 drop into both eyes at bedtime.      cloNIDine (CATAPRES) 0.2 MG tablet Take 0.2 mg by mouth 3 (three) times daily.     dorzolamide-timolol (COSOPT) 22.3-6.8 MG/ML ophthalmic solution INSTILL 1 DROP INTO EACH EYE TWICE DAILY Ophthalmic for  38 Days     gabapentin (NEURONTIN) 300 MG capsule Take 300 mg by mouth 2 (two) times daily as needed (neuropathy).      metFORMIN (GLUCOPHAGE-XR) 500 MG 24 hr tablet Take 500 mg by mouth daily with supper.     metoprolol succinate (TOPROL-XL) 50 MG 24 hr tablet Take 1 tablet by mouth 2 (two) times daily.     phenytoin (DILANTIN) 100 MG ER capsule Take 100 mg by mouth 2 (two) times daily.      rosuvastatin (CRESTOR) 5 MG tablet Take 5 mg by mouth daily.     tamsulosin (FLOMAX) 0.4 MG CAPS capsule Take 0.4 mg by mouth 2 (two) times daily.     triamterene-hydrochlorothiazide (MAXZIDE-25) 37.5-25 MG tablet Take 1 tablet by mouth every morning.     verapamil (CALAN-SR) 240 MG CR tablet Take 240 mg by mouth daily.      warfarin (COUMADIN) 5 MG tablet Take 1 tablet (5 mg total) by mouth daily. Or as instructed by doctor's office. 30 tablet 0   No current facility-administered medications for this visit.    REVIEW OF SYSTEMS:   All relevant systems were reviewed with the patient and are negative.  PHYSICAL EXAMINATION: ECOG PERFORMANCE STATUS: 0 - Asymptomatic  Vitals:   01/03/24 1124 01/03/24 1125  BP: (!) 172/93 (!) 157/97  Pulse: 66   Resp: 18   Temp: 98.1 F (36.7 C)   SpO2: 100%    Filed Weights   01/03/24 1124  Weight: 198 lb 6.4 oz (90 kg)    GENERAL: alert, no distress and comfortable LUNGS: Effort normal  Musculoskeletal: no cyanosis and edema.  Right lower extremity no edema; left lower extremity edema  LABORATORY DATA:  I have reviewed the data as listed   RADIOGRAPHIC STUDIES: I have personally reviewed the radiological images as listed and agreed with the findings in the report. No results found.

## 2024-01-03 ENCOUNTER — Inpatient Hospital Stay: Payer: HMO

## 2024-01-03 ENCOUNTER — Inpatient Hospital Stay (HOSPITAL_BASED_OUTPATIENT_CLINIC_OR_DEPARTMENT_OTHER): Payer: HMO

## 2024-01-03 VITALS — BP 157/97 | HR 66 | Temp 98.1°F | Resp 18 | Wt 198.4 lb

## 2024-01-03 DIAGNOSIS — Z8546 Personal history of malignant neoplasm of prostate: Secondary | ICD-10-CM | POA: Insufficient documentation

## 2024-01-03 DIAGNOSIS — Z9089 Acquired absence of other organs: Secondary | ICD-10-CM | POA: Diagnosis not present

## 2024-01-03 DIAGNOSIS — Z823 Family history of stroke: Secondary | ICD-10-CM | POA: Diagnosis not present

## 2024-01-03 DIAGNOSIS — Z8673 Personal history of transient ischemic attack (TIA), and cerebral infarction without residual deficits: Secondary | ICD-10-CM | POA: Diagnosis not present

## 2024-01-03 DIAGNOSIS — Z860101 Personal history of adenomatous and serrated colon polyps: Secondary | ICD-10-CM | POA: Diagnosis not present

## 2024-01-03 DIAGNOSIS — I82431 Acute embolism and thrombosis of right popliteal vein: Secondary | ICD-10-CM | POA: Diagnosis not present

## 2024-01-03 DIAGNOSIS — Z7901 Long term (current) use of anticoagulants: Secondary | ICD-10-CM | POA: Diagnosis not present

## 2024-01-03 DIAGNOSIS — Z8042 Family history of malignant neoplasm of prostate: Secondary | ICD-10-CM | POA: Diagnosis not present

## 2024-01-03 DIAGNOSIS — Z833 Family history of diabetes mellitus: Secondary | ICD-10-CM | POA: Insufficient documentation

## 2024-01-03 DIAGNOSIS — Z881 Allergy status to other antibiotic agents status: Secondary | ICD-10-CM | POA: Diagnosis not present

## 2024-01-03 DIAGNOSIS — I10 Essential (primary) hypertension: Secondary | ICD-10-CM | POA: Diagnosis not present

## 2024-01-03 DIAGNOSIS — E785 Hyperlipidemia, unspecified: Secondary | ICD-10-CM | POA: Insufficient documentation

## 2024-01-03 DIAGNOSIS — F17290 Nicotine dependence, other tobacco product, uncomplicated: Secondary | ICD-10-CM | POA: Insufficient documentation

## 2024-01-03 DIAGNOSIS — Z79899 Other long term (current) drug therapy: Secondary | ICD-10-CM | POA: Insufficient documentation

## 2024-01-03 DIAGNOSIS — E119 Type 2 diabetes mellitus without complications: Secondary | ICD-10-CM | POA: Insufficient documentation

## 2024-01-03 DIAGNOSIS — Z8249 Family history of ischemic heart disease and other diseases of the circulatory system: Secondary | ICD-10-CM | POA: Diagnosis not present

## 2024-01-03 DIAGNOSIS — H409 Unspecified glaucoma: Secondary | ICD-10-CM | POA: Insufficient documentation

## 2024-01-03 LAB — CBC WITH DIFFERENTIAL (CANCER CENTER ONLY)
Abs Immature Granulocytes: 0.01 10*3/uL (ref 0.00–0.07)
Basophils Absolute: 0 10*3/uL (ref 0.0–0.1)
Basophils Relative: 0 %
Eosinophils Absolute: 0 10*3/uL (ref 0.0–0.5)
Eosinophils Relative: 0 %
HCT: 43.1 % (ref 39.0–52.0)
Hemoglobin: 13.5 g/dL (ref 13.0–17.0)
Immature Granulocytes: 0 %
Lymphocytes Relative: 30 %
Lymphs Abs: 1.4 10*3/uL (ref 0.7–4.0)
MCH: 29.6 pg (ref 26.0–34.0)
MCHC: 31.3 g/dL (ref 30.0–36.0)
MCV: 94.5 fL (ref 80.0–100.0)
Monocytes Absolute: 0.5 10*3/uL (ref 0.1–1.0)
Monocytes Relative: 10 %
Neutro Abs: 2.8 10*3/uL (ref 1.7–7.7)
Neutrophils Relative %: 60 %
Platelet Count: 203 10*3/uL (ref 150–400)
RBC: 4.56 MIL/uL (ref 4.22–5.81)
RDW: 13.5 % (ref 11.5–15.5)
WBC Count: 4.7 10*3/uL (ref 4.0–10.5)
nRBC: 0 % (ref 0.0–0.2)

## 2024-01-03 LAB — PROTIME-INR
INR: 2.3 — ABNORMAL HIGH (ref 0.8–1.2)
Prothrombin Time: 25.4 s — ABNORMAL HIGH (ref 11.4–15.2)

## 2024-01-03 LAB — D-DIMER, QUANTITATIVE: D-Dimer, Quant: 1.46 ug{FEU}/mL — ABNORMAL HIGH (ref 0.00–0.50)

## 2024-01-03 NOTE — Patient Instructions (Signed)
83 y.o.male with history of prostate cancer treated with seed implants, diabetes mellitus type 2, glaucoma, hyperlipidemia, hypertension, stroke, tubular adenoma of colon and DVT referred to [facility] for history of RLE DVT.  Last DVT was seen in 01/2023.   First episode: 2024.  Setting: unprovoked  Risk factors: Unprovoked, personal history of VTE.  Family history of VTE.  Age, male, developed DVT while on aspirin. Treatment: warfarin alternating 2.5 mg and 5 mg every other day.   We discussed risks and benefit of anticoagulation today.  We discussed warfarin has long half-life.  Given this is the first episode, may consider stopping.  If discontinuing anticoagulation, he will need to monitor signs and symptoms of DVT and PE closely and report to ED immediately.  Sometimes life-threatening PE can occur.  He does have risk factors of recurrent VTE.  If continuing anticoagulation, in the setting of trauma, uncontrolled bleeding it can be life-threatening.  He does not have recurrent falling, dizziness, and is not frail. He appears to be tolerating well.  No signs of bleeding.  After discussion, I think it reasonable to continue anticoagulation.  Patient may continue to follow-up about every 6 months, sooner if concerning symptoms.   Patient education for risk factors and prevention of clotting We talked about modifiable risk factors.  Prevention of clotting like deep vein thrombosis including: Strong risk factors including fractures of lower limb, hospitalization for severe illness, such as heart failure, myocardial infarction, spinal cord injury, major trauma, hip or knee replacement, and previous VTE. avoid prolonged immobilization and moving extremities every 1-2 hours during long car rides or flights.  Taking a break and moving extremities if working in a job setting with prolonged sitting.   Avoid dehydration, especially in high altitude. Avoid cigarette  smoking Avoid use of hormone replacement therapy, estrogen-containing contraceptive in women.   Maintaining healthy lifestyle to prevent development of diabetes.  Weight loss if BMI over 30.  Regular exercises but not extreme.   Other risk factors for clotting are surgery, hospitalization, inflammatory disease or severe infection, trauma or injuries from inflammatory state and stasis. If developing one-sided leg swelling, pain, color change, chest pain, sudden short of breath, difficulty taking deep breaths, taking deep breath with chest discomfort or pain, dizziness or heart racing sensation, go to the emergency room immediately for evaluation. If developing trauma, uncontrolled bleeding, such as bloody stools report ED immediately. Avoid NSAIDs, aspirin while on blood thinner.   Follow up in about 6 months.

## 2024-01-06 DIAGNOSIS — Z8546 Personal history of malignant neoplasm of prostate: Secondary | ICD-10-CM | POA: Diagnosis not present

## 2024-01-06 DIAGNOSIS — Z1339 Encounter for screening examination for other mental health and behavioral disorders: Secondary | ICD-10-CM | POA: Diagnosis not present

## 2024-01-06 DIAGNOSIS — E1121 Type 2 diabetes mellitus with diabetic nephropathy: Secondary | ICD-10-CM | POA: Diagnosis not present

## 2024-01-06 DIAGNOSIS — M25562 Pain in left knee: Secondary | ICD-10-CM | POA: Diagnosis not present

## 2024-01-06 DIAGNOSIS — E785 Hyperlipidemia, unspecified: Secondary | ICD-10-CM | POA: Diagnosis not present

## 2024-01-06 DIAGNOSIS — R809 Proteinuria, unspecified: Secondary | ICD-10-CM | POA: Diagnosis not present

## 2024-01-06 DIAGNOSIS — G40909 Epilepsy, unspecified, not intractable, without status epilepticus: Secondary | ICD-10-CM | POA: Diagnosis not present

## 2024-01-06 DIAGNOSIS — I10 Essential (primary) hypertension: Secondary | ICD-10-CM | POA: Diagnosis not present

## 2024-01-06 DIAGNOSIS — Z1331 Encounter for screening for depression: Secondary | ICD-10-CM | POA: Diagnosis not present

## 2024-01-06 DIAGNOSIS — I82501 Chronic embolism and thrombosis of unspecified deep veins of right lower extremity: Secondary | ICD-10-CM | POA: Diagnosis not present

## 2024-01-06 DIAGNOSIS — Z0000 Encounter for general adult medical examination without abnormal findings: Secondary | ICD-10-CM | POA: Diagnosis not present

## 2024-01-06 DIAGNOSIS — G8929 Other chronic pain: Secondary | ICD-10-CM | POA: Diagnosis not present

## 2024-01-06 DIAGNOSIS — Z7901 Long term (current) use of anticoagulants: Secondary | ICD-10-CM | POA: Diagnosis not present

## 2024-01-29 DIAGNOSIS — Z7901 Long term (current) use of anticoagulants: Secondary | ICD-10-CM | POA: Diagnosis not present

## 2024-01-29 DIAGNOSIS — I82501 Chronic embolism and thrombosis of unspecified deep veins of right lower extremity: Secondary | ICD-10-CM | POA: Diagnosis not present

## 2024-02-18 ENCOUNTER — Other Ambulatory Visit (HOSPITAL_COMMUNITY): Payer: Self-pay

## 2024-02-26 DIAGNOSIS — Z7901 Long term (current) use of anticoagulants: Secondary | ICD-10-CM | POA: Diagnosis not present

## 2024-02-26 DIAGNOSIS — I82501 Chronic embolism and thrombosis of unspecified deep veins of right lower extremity: Secondary | ICD-10-CM | POA: Diagnosis not present

## 2024-03-02 DIAGNOSIS — E785 Hyperlipidemia, unspecified: Secondary | ICD-10-CM | POA: Diagnosis not present

## 2024-03-02 DIAGNOSIS — E1165 Type 2 diabetes mellitus with hyperglycemia: Secondary | ICD-10-CM | POA: Diagnosis not present

## 2024-03-02 DIAGNOSIS — I10 Essential (primary) hypertension: Secondary | ICD-10-CM | POA: Diagnosis not present

## 2024-03-02 DIAGNOSIS — N400 Enlarged prostate without lower urinary tract symptoms: Secondary | ICD-10-CM | POA: Diagnosis not present

## 2024-03-02 DIAGNOSIS — E663 Overweight: Secondary | ICD-10-CM | POA: Diagnosis not present

## 2024-03-02 DIAGNOSIS — G40909 Epilepsy, unspecified, not intractable, without status epilepticus: Secondary | ICD-10-CM | POA: Diagnosis not present

## 2024-03-02 DIAGNOSIS — G8929 Other chronic pain: Secondary | ICD-10-CM | POA: Diagnosis not present

## 2024-03-02 DIAGNOSIS — Z7901 Long term (current) use of anticoagulants: Secondary | ICD-10-CM | POA: Diagnosis not present

## 2024-03-02 DIAGNOSIS — E1169 Type 2 diabetes mellitus with other specified complication: Secondary | ICD-10-CM | POA: Diagnosis not present

## 2024-03-13 DIAGNOSIS — H401132 Primary open-angle glaucoma, bilateral, moderate stage: Secondary | ICD-10-CM | POA: Diagnosis not present

## 2024-03-13 DIAGNOSIS — H25813 Combined forms of age-related cataract, bilateral: Secondary | ICD-10-CM | POA: Diagnosis not present

## 2024-03-13 DIAGNOSIS — E119 Type 2 diabetes mellitus without complications: Secondary | ICD-10-CM | POA: Diagnosis not present

## 2024-03-25 DIAGNOSIS — Z7901 Long term (current) use of anticoagulants: Secondary | ICD-10-CM | POA: Diagnosis not present

## 2024-03-25 DIAGNOSIS — I82501 Chronic embolism and thrombosis of unspecified deep veins of right lower extremity: Secondary | ICD-10-CM | POA: Diagnosis not present

## 2024-04-09 DIAGNOSIS — M1712 Unilateral primary osteoarthritis, left knee: Secondary | ICD-10-CM | POA: Diagnosis not present

## 2024-04-13 DIAGNOSIS — L738 Other specified follicular disorders: Secondary | ICD-10-CM | POA: Diagnosis not present

## 2024-04-13 DIAGNOSIS — D485 Neoplasm of uncertain behavior of skin: Secondary | ICD-10-CM | POA: Diagnosis not present

## 2024-04-13 DIAGNOSIS — L281 Prurigo nodularis: Secondary | ICD-10-CM | POA: Diagnosis not present

## 2024-04-13 DIAGNOSIS — L720 Epidermal cyst: Secondary | ICD-10-CM | POA: Diagnosis not present

## 2024-04-22 DIAGNOSIS — Z8546 Personal history of malignant neoplasm of prostate: Secondary | ICD-10-CM | POA: Diagnosis not present

## 2024-04-23 DIAGNOSIS — Z7901 Long term (current) use of anticoagulants: Secondary | ICD-10-CM | POA: Diagnosis not present

## 2024-04-23 DIAGNOSIS — E1121 Type 2 diabetes mellitus with diabetic nephropathy: Secondary | ICD-10-CM | POA: Diagnosis not present

## 2024-04-23 DIAGNOSIS — I82501 Chronic embolism and thrombosis of unspecified deep veins of right lower extremity: Secondary | ICD-10-CM | POA: Diagnosis not present

## 2024-05-01 DIAGNOSIS — H401132 Primary open-angle glaucoma, bilateral, moderate stage: Secondary | ICD-10-CM | POA: Diagnosis not present

## 2024-05-07 DIAGNOSIS — H401132 Primary open-angle glaucoma, bilateral, moderate stage: Secondary | ICD-10-CM | POA: Diagnosis not present

## 2024-05-11 DIAGNOSIS — Z8546 Personal history of malignant neoplasm of prostate: Secondary | ICD-10-CM | POA: Diagnosis not present

## 2024-05-11 DIAGNOSIS — R310 Gross hematuria: Secondary | ICD-10-CM | POA: Diagnosis not present

## 2024-05-11 DIAGNOSIS — N5201 Erectile dysfunction due to arterial insufficiency: Secondary | ICD-10-CM | POA: Diagnosis not present

## 2024-05-11 DIAGNOSIS — R350 Frequency of micturition: Secondary | ICD-10-CM | POA: Diagnosis not present

## 2024-05-26 DIAGNOSIS — Z7901 Long term (current) use of anticoagulants: Secondary | ICD-10-CM | POA: Diagnosis not present

## 2024-05-26 DIAGNOSIS — I82501 Chronic embolism and thrombosis of unspecified deep veins of right lower extremity: Secondary | ICD-10-CM | POA: Diagnosis not present

## 2024-06-05 DIAGNOSIS — H401132 Primary open-angle glaucoma, bilateral, moderate stage: Secondary | ICD-10-CM | POA: Diagnosis not present

## 2024-06-15 DIAGNOSIS — H6123 Impacted cerumen, bilateral: Secondary | ICD-10-CM | POA: Diagnosis not present

## 2024-06-15 DIAGNOSIS — Z8673 Personal history of transient ischemic attack (TIA), and cerebral infarction without residual deficits: Secondary | ICD-10-CM | POA: Diagnosis not present

## 2024-06-15 DIAGNOSIS — R809 Proteinuria, unspecified: Secondary | ICD-10-CM | POA: Diagnosis not present

## 2024-06-15 DIAGNOSIS — I10 Essential (primary) hypertension: Secondary | ICD-10-CM | POA: Diagnosis not present

## 2024-06-15 DIAGNOSIS — Z8546 Personal history of malignant neoplasm of prostate: Secondary | ICD-10-CM | POA: Diagnosis not present

## 2024-06-15 DIAGNOSIS — E1121 Type 2 diabetes mellitus with diabetic nephropathy: Secondary | ICD-10-CM | POA: Diagnosis not present

## 2024-06-25 DIAGNOSIS — Z7901 Long term (current) use of anticoagulants: Secondary | ICD-10-CM | POA: Diagnosis not present

## 2024-06-25 DIAGNOSIS — I82501 Chronic embolism and thrombosis of unspecified deep veins of right lower extremity: Secondary | ICD-10-CM | POA: Diagnosis not present

## 2024-06-26 ENCOUNTER — Other Ambulatory Visit: Payer: Self-pay | Admitting: *Deleted

## 2024-06-26 DIAGNOSIS — I82431 Acute embolism and thrombosis of right popliteal vein: Secondary | ICD-10-CM

## 2024-06-26 NOTE — Progress Notes (Signed)
 [facility] OFFICE PROGRESS NOTE  Patient Care Team: Yolande Toribio MATSU, MD as PCP - General (Internal Medicine)  83 y.o.male with history of prostate cancer treated with seed implants, diabetes mellitus type 2, glaucoma, hyperlipidemia, hypertension, stroke, tubular adenoma of colon and DVT referred to [facility] for history of RLE DVT.  Last DVT was seen in 01/2023.   First episode: 2024.  Setting: unprovoked  Risk factors: Unprovoked, personal history of VTE.  Family history of VTE.  Age, male, developed DVT while on aspirin . Treatment: warfarin alternating 2.5 mg and 5 mg every other day. (Due to interaction of dilantin  with apixaban)  Discussed risks and benefits of continuing anticoagulation.  Discussed risks of bleeding.  Discussed risks of recurrent thrombosis if discontinuing anticoagulation.  Discussed that he may watch for new symptoms if he wishes to discontinue anticoagulation.  After discussion, he is more comfortable continuing anticoagulation at this time as he does not want any recurrence. Assessment & Plan Acute deep vein thrombosis (DVT) of popliteal vein of right lower extremity (HCC) Patient has been on warfarin Continue follow-up for INR with PCP Follow-up with us  in about 6 months, earlier if needed.  Of note, reports BP was mostly about 130s at home.  He is asymptomatic.  He is taking his medication.  Orders Placed This Encounter  Procedures   CBC with Differential (Cancer Center Only)    Standing Status:   Future    Expiration Date:   06/29/2025   D-dimer, quantitative    Standing Status:   Future    Expiration Date:   06/29/2025   Protime-INR    Standing Status:   Future    Expiration Date:   06/29/2025     Pauletta JAYSON Chihuahua, MD  INTERVAL HISTORY: Patient returns for follow-up. No bleeding, bloody or dark stool. No fall. No bloody urine. INR was 3 on 7/7. Dr. Cleotilde from [facility] is checking it. No chest  pain, leg swelling or trouble breathing.  BP is in the 130's at home.  He recalled driving to [facility] but took break for gas.   No seizure for 30 years. On dilantin .  Hematology history: He has been active most of his life and still do push ups. He can 50 at a time. No smoking, drinking.   In Feb 2024. He got up one morning and noted right calf is larger than left. He saw his PCP and US  was done. Due to apixaban interferes with dilantin  so was placed on warfarin. He has been tolerating well. He feels the right leg is back to normal like the left. No swelling or pain.   No injury, trauma, surgery or long flight or car ride before the event. No testosterone.   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 He is seeing Urology.   He otherwise feels well. No bone or back pain. Appetite is good.   He saw PCP Dr. Cleotilde last week and INR was 2.5. He sees them about every month. No bloody stool or dark stool or bleeding.    01/18/23 US : Summary:  RIGHT:  - Findings consistent with acute and occlusive deep vein thrombosis involving the proximal popliteal vein and peroneal veins.   - Partially occlusive and age-indeterminate thrombosis involving the mid and distal popliteal vein, tibioperoneal trunk, 1 paired posterior tibial vein.  - Findings consistent with acute superficial vein  thrombosis involving the right small saphenous vein    Patient denies history of DVT prior to age 44. Report his oldest daughter DVT after long car drive.   PHYSICAL EXAMINATION: ECOG PERFORMANCE STATUS: 0 - Asymptomatic  Vitals:   06/29/24 1052  BP: (!) 200/104  Pulse: 64  Resp: 18  Temp: 98.4 F (36.9 C)  SpO2: 97%   Filed Weights   06/29/24 1052  Weight: 197 lb 1.6 oz (89.4 kg)   No respiratory distress.  Clear to auscultation bilaterally Heart sounds normal Right and left leg no swelling.  Relevant data reviewed  during this visit included lab.

## 2024-06-26 NOTE — Assessment & Plan Note (Addendum)
 Patient has been on warfarin Continue follow-up for INR with PCP Follow-up with us  in about 6 months, earlier if needed.

## 2024-06-29 ENCOUNTER — Inpatient Hospital Stay (HOSPITAL_BASED_OUTPATIENT_CLINIC_OR_DEPARTMENT_OTHER): Payer: HMO

## 2024-06-29 ENCOUNTER — Inpatient Hospital Stay: Payer: HMO

## 2024-06-29 VITALS — BP 200/104 | HR 64 | Temp 98.4°F | Resp 18 | Wt 197.1 lb

## 2024-06-29 DIAGNOSIS — I82431 Acute embolism and thrombosis of right popliteal vein: Secondary | ICD-10-CM

## 2024-06-29 DIAGNOSIS — Z79899 Other long term (current) drug therapy: Secondary | ICD-10-CM | POA: Diagnosis not present

## 2024-06-29 DIAGNOSIS — Z7901 Long term (current) use of anticoagulants: Secondary | ICD-10-CM | POA: Insufficient documentation

## 2024-06-29 DIAGNOSIS — Z86718 Personal history of other venous thrombosis and embolism: Secondary | ICD-10-CM | POA: Insufficient documentation

## 2024-06-29 LAB — PROTIME-INR
INR: 2.6 — ABNORMAL HIGH (ref 0.8–1.2)
Prothrombin Time: 29.3 s — ABNORMAL HIGH (ref 11.4–15.2)

## 2024-06-29 LAB — CBC WITH DIFFERENTIAL (CANCER CENTER ONLY)
Abs Immature Granulocytes: 0.01 K/uL (ref 0.00–0.07)
Basophils Absolute: 0 K/uL (ref 0.0–0.1)
Basophils Relative: 1 %
Eosinophils Absolute: 0 K/uL (ref 0.0–0.5)
Eosinophils Relative: 1 %
HCT: 41 % (ref 39.0–52.0)
Hemoglobin: 13.4 g/dL (ref 13.0–17.0)
Immature Granulocytes: 0 %
Lymphocytes Relative: 39 %
Lymphs Abs: 1.8 K/uL (ref 0.7–4.0)
MCH: 29.6 pg (ref 26.0–34.0)
MCHC: 32.7 g/dL (ref 30.0–36.0)
MCV: 90.5 fL (ref 80.0–100.0)
Monocytes Absolute: 0.5 K/uL (ref 0.1–1.0)
Monocytes Relative: 10 %
Neutro Abs: 2.3 K/uL (ref 1.7–7.7)
Neutrophils Relative %: 49 %
Platelet Count: 182 K/uL (ref 150–400)
RBC: 4.53 MIL/uL (ref 4.22–5.81)
RDW: 13.4 % (ref 11.5–15.5)
WBC Count: 4.7 K/uL (ref 4.0–10.5)
nRBC: 0 % (ref 0.0–0.2)

## 2024-06-29 LAB — D-DIMER, QUANTITATIVE: D-Dimer, Quant: 0.97 ug{FEU}/mL — ABNORMAL HIGH (ref 0.00–0.50)

## 2024-06-30 ENCOUNTER — Telehealth: Payer: Self-pay

## 2024-06-30 NOTE — Telephone Encounter (Signed)
Scheduled appointments with the patient

## 2024-07-23 DIAGNOSIS — H401132 Primary open-angle glaucoma, bilateral, moderate stage: Secondary | ICD-10-CM | POA: Diagnosis not present

## 2024-07-28 DIAGNOSIS — I82501 Chronic embolism and thrombosis of unspecified deep veins of right lower extremity: Secondary | ICD-10-CM | POA: Diagnosis not present

## 2024-07-28 DIAGNOSIS — E1121 Type 2 diabetes mellitus with diabetic nephropathy: Secondary | ICD-10-CM | POA: Diagnosis not present

## 2024-07-28 DIAGNOSIS — Z7901 Long term (current) use of anticoagulants: Secondary | ICD-10-CM | POA: Diagnosis not present

## 2024-08-27 DIAGNOSIS — Z7901 Long term (current) use of anticoagulants: Secondary | ICD-10-CM | POA: Diagnosis not present

## 2024-08-27 DIAGNOSIS — I82501 Chronic embolism and thrombosis of unspecified deep veins of right lower extremity: Secondary | ICD-10-CM | POA: Diagnosis not present

## 2024-09-29 DIAGNOSIS — Z7901 Long term (current) use of anticoagulants: Secondary | ICD-10-CM | POA: Diagnosis not present

## 2024-09-29 DIAGNOSIS — I82501 Chronic embolism and thrombosis of unspecified deep veins of right lower extremity: Secondary | ICD-10-CM | POA: Diagnosis not present

## 2024-10-29 DIAGNOSIS — Z7901 Long term (current) use of anticoagulants: Secondary | ICD-10-CM | POA: Diagnosis not present

## 2024-10-29 DIAGNOSIS — I82501 Chronic embolism and thrombosis of unspecified deep veins of right lower extremity: Secondary | ICD-10-CM | POA: Diagnosis not present

## 2024-11-07 ENCOUNTER — Emergency Department (HOSPITAL_COMMUNITY)
Admission: EM | Admit: 2024-11-07 | Discharge: 2024-11-07 | Disposition: A | Discharge status: Home / Self Care | Attending: Emergency Medicine | Admitting: Emergency Medicine

## 2024-11-07 ENCOUNTER — Encounter (HOSPITAL_COMMUNITY): Payer: Self-pay

## 2024-11-07 ENCOUNTER — Emergency Department (HOSPITAL_COMMUNITY)

## 2024-11-07 DIAGNOSIS — Z79899 Other long term (current) drug therapy: Secondary | ICD-10-CM | POA: Diagnosis not present

## 2024-11-07 DIAGNOSIS — R030 Elevated blood-pressure reading, without diagnosis of hypertension: Secondary | ICD-10-CM | POA: Diagnosis not present

## 2024-11-07 DIAGNOSIS — R0602 Shortness of breath: Secondary | ICD-10-CM | POA: Diagnosis not present

## 2024-11-07 DIAGNOSIS — Z7901 Long term (current) use of anticoagulants: Secondary | ICD-10-CM | POA: Diagnosis not present

## 2024-11-07 DIAGNOSIS — I10 Essential (primary) hypertension: Secondary | ICD-10-CM | POA: Insufficient documentation

## 2024-11-07 LAB — COMPREHENSIVE METABOLIC PANEL WITH GFR
ALT: 38 U/L (ref 0–44)
AST: 46 U/L — ABNORMAL HIGH (ref 15–41)
Albumin: 3.9 g/dL (ref 3.5–5.0)
Alkaline Phosphatase: 136 U/L — ABNORMAL HIGH (ref 38–126)
Anion gap: 8 (ref 5–15)
BUN: 8 mg/dL (ref 8–23)
CO2: 27 mmol/L (ref 22–32)
Calcium: 9.5 mg/dL (ref 8.9–10.3)
Chloride: 98 mmol/L (ref 98–111)
Creatinine, Ser: 0.73 mg/dL (ref 0.61–1.24)
GFR, Estimated: >60 mL/min (ref >60)
Glucose, Bld: 126 mg/dL — ABNORMAL HIGH (ref 70–99)
Potassium: 3.9 mmol/L (ref 3.5–5.1)
Sodium: 133 mmol/L — ABNORMAL LOW (ref 135–145)
Total Bilirubin: 0.4 mg/dL (ref 0.0–1.2)
Total Protein: 7.2 g/dL (ref 6.5–8.1)

## 2024-11-07 LAB — CBC WITH DIFFERENTIAL/PLATELET
Abs Immature Granulocytes: 0.01 K/uL (ref 0.00–0.07)
Basophils Absolute: 0 K/uL (ref 0.0–0.1)
Basophils Relative: 0 %
Eosinophils Absolute: 0 K/uL (ref 0.0–0.5)
Eosinophils Relative: 0 %
HCT: 42.8 % (ref 39.0–52.0)
Hemoglobin: 13.7 g/dL (ref 13.0–17.0)
Immature Granulocytes: 0 %
Lymphocytes Relative: 28 %
Lymphs Abs: 1.3 K/uL (ref 0.7–4.0)
MCH: 29.3 pg (ref 26.0–34.0)
MCHC: 32 g/dL (ref 30.0–36.0)
MCV: 91.6 fL (ref 80.0–100.0)
Monocytes Absolute: 0.4 K/uL (ref 0.1–1.0)
Monocytes Relative: 10 %
Neutro Abs: 2.9 K/uL (ref 1.7–7.7)
Neutrophils Relative %: 62 %
Platelets: 211 K/uL (ref 150–400)
RBC: 4.67 MIL/uL (ref 4.22–5.81)
RDW: 13.5 % (ref 11.5–15.5)
WBC: 4.6 K/uL (ref 4.0–10.5)
nRBC: 0 % (ref 0.0–0.2)

## 2024-11-07 LAB — TROPONIN T, HIGH SENSITIVITY
Troponin T High Sensitivity: <15 ng/L (ref 0–19)
Troponin T High Sensitivity: <15 ng/L (ref 0–19)

## 2024-11-07 LAB — PROTIME-INR
INR: 2 — ABNORMAL HIGH (ref 0.8–1.2)
Prothrombin Time: 23.7 s — ABNORMAL HIGH (ref 11.4–15.2)

## 2024-11-07 LAB — PHENYTOIN LEVEL, TOTAL: Phenytoin Lvl: 12.6 ug/mL (ref 10.0–20.0)

## 2024-11-07 LAB — PRO BRAIN NATRIURETIC PEPTIDE: Pro Brain Natriuretic Peptide: 419 pg/mL — ABNORMAL HIGH (ref <300.0)

## 2024-11-07 MED ORDER — LABETALOL HCL 5 MG/ML IV SOLN: 40.0000 mg | Freq: Once | INTRAVENOUS | Status: DC

## 2024-11-07 MED ORDER — LABETALOL HCL 5 MG/ML IV SOLN
20.0000 mg | Freq: Once | INTRAVENOUS | Status: AC
  Administered 2024-11-07: 20 mg via INTRAVENOUS
  Filled 2024-11-07: qty 4

## 2024-11-07 NOTE — ED Triage Notes (Addendum)
 Patient said yesterday his blood pressure spiked to 198/100. Has been taking his blood pressure medications. This morning he checked his blood pressure and it was 201/100. Denies chest pain but has shortness of breath and feels clammy and wobbly. Has  a headache and feels that his vision is blurred.

## 2024-11-07 NOTE — ED Notes (Signed)
 Notified MD in regards to pt's HTN upon taking d/c vitals, MD clears pt for discharge, informed to instruct pt to following discharge instructions to increase dosing of his HTN medications. Pt in NAD, no complaints at this time.

## 2024-11-07 NOTE — Discharge Instructions (Signed)
 Increase your metoprolol blood pressure medicine so you are taking 2 tablets twice a day.  That would be 100 mg twice a day.  Follow-up with your family doctor this week

## 2024-11-07 NOTE — ED Notes (Signed)
 Per verbal order from Dr. Zammit, pt took 2 of his Metoprolol tablets prior to leaving.   Pt A&OX4, verbalized understanding of d/c instructions, medications and follow up care.

## 2024-11-07 NOTE — ED Provider Notes (Signed)
 [place] EMERGENCY DEPARTMENT AT [facility] Provider Note   CSN: [id number] Arrival date & time: 11/07/24  1020     Patient presents with: Hypertension   Anthony Lennartz Sr. is a 83 y.o. male.  {Add pertinent medical, surgical, social history, OB history to YEP:67052} Patient states his blood pressure has been elevated lately.   Hypertension       Prior to Admission medications   Medication Sig Start Date End Date Taking? Authorizing Provider  bimatoprost (LUMIGAN) 0.01 % SOLN Place 1 drop into both eyes at bedtime.     [provider]  cloNIDine (CATAPRES) 0.2 MG tablet Take 0.2 mg by mouth 3 (three) times daily.    [provider]  dorzolamide-timolol (COSOPT) 22.3-6.8 MG/ML ophthalmic solution INSTILL 1 DROP INTO EACH EYE TWICE DAILY Ophthalmic for 38 Days    [provider]  gabapentin (NEURONTIN) 300 MG capsule Take 300 mg by mouth 2 (two) times daily as needed (neuropathy).  12/13/18   [provider]  metFORMIN (GLUCOPHAGE-XR) 500 MG 24 hr tablet Take 500 mg by mouth daily with supper. 01/02/19   [provider]  metoprolol succinate (TOPROL-XL) 50 MG 24 hr tablet Take 1 tablet by mouth 2 (two) times daily.    [provider]  phenytoin  (DILANTIN ) 100 MG ER capsule Take 100 mg by mouth 2 (two) times daily.     [provider]  rosuvastatin (CRESTOR) 5 MG tablet Take 5 mg by mouth daily. 02/25/22   [provider]  tamsulosin (FLOMAX) 0.4 MG CAPS capsule Take 0.4 mg by mouth 2 (two) times daily. 03/30/22   [provider]  triamterene-hydrochlorothiazide (MAXZIDE-25) 37.5-25 MG tablet Take 1 tablet by mouth every morning.    [provider]  verapamil (CALAN-SR) 240 MG CR tablet Take 240 mg by mouth daily.     [provider]  warfarin (COUMADIN ) 5 MG tablet Take 1 tablet (5 mg total) by mouth daily. Or as instructed by doctor's office. 02/07/23   Barbarann Dixon B,  RPH-CPP    Allergies: Clindamycin/lincomycin, Grapefruit concentrate, Ace inhibitors, and Ciprofloxacin    Review of Systems  Updated Vital Signs BP (!) 143/83   Pulse 61   Temp 98.4 F (36.9 C) (Oral)   Resp 17   Ht 6' (1.829 m)   Wt 86.2 kg   SpO2 97%   BMI 25.77 kg/m   Physical Exam  (all labs ordered are listed, but only abnormal results are displayed) Labs Reviewed  COMPREHENSIVE METABOLIC PANEL WITH GFR - Abnormal; Notable for the following components:      Result Value   Sodium 133 (*)    Glucose, Bld 126 (*)    AST 46 (*)    Alkaline Phosphatase 136 (*)    All other components within normal limits  PRO BRAIN NATRIURETIC PEPTIDE - Abnormal; Notable for the following components:   Pro Brain Natriuretic Peptide 419.0 (*)    All other components within normal limits  PROTIME-INR - Abnormal; Notable for the following components:   Prothrombin Time 23.7 (*)    INR 2.0 (*)    All other components within normal limits  CBC WITH DIFFERENTIAL/PLATELET  PHENYTOIN  LEVEL, TOTAL  TROPONIN T, HIGH SENSITIVITY  TROPONIN T, HIGH SENSITIVITY    EKG: EKG Interpretation Date/Time:  Saturday November 07 2024 10:49:44 EST Ventricular Rate:  69 PR Interval:  210 QRS Duration:  74 QT Interval:  390 QTC Calculation: 418 R Axis:   62  Text Interpretation: Sinus rhythm Ventricular premature complex Anteroseptal infarct, old Confirmed by Suzette Pac [phone]) on 11/07/2024 2:29:56 PM  Radiology: [facility] Chest Port 1 View Result Date: 11/07/2024 CLINICAL DATA:  High blood pressure yesterday.  Short of breath. EXAM: PORTABLE CHEST 1 VIEW COMPARISON:  01/22/2019. FINDINGS: Cardiac silhouette is normal in size. No mediastinal or hilar masses. Clear lungs.  No pleural effusion or pneumothorax. Skeletal structures are grossly intact. IMPRESSION: No active disease. Electronically Signed   By: Alm Parkins M.D.   On: 11/07/2024 11:44    {Document cardiac monitor, telemetry assessment  procedure when appropriate:32947} Procedures   Medications Ordered in the ED  labetalol (NORMODYNE) injection 40 mg (0 mg Intravenous Hold 11/07/24 1347)  labetalol (NORMODYNE) injection 20 mg (20 mg Intravenous Given 11/07/24 1123)      {Click here for ABCD2, HEART and other calculators REFRESH Note before signing:1}                              Medical Decision Making Amount and/or Complexity of Data Reviewed Labs: ordered. Radiology: ordered. ECG/medicine tests: ordered.  Risk Prescription drug management.   Poorly controlled blood pressure.  Patient will increase his metoprolol so he is on 100 mg twice a day and follow-up with his PCP  {Document critical care time when appropriate  Document review of labs and clinical decision tools ie CHADS2VASC2, etc  Document your independent review of radiology images and any outside records  Document your discussion with family members, caretakers and with consultants  Document social determinants of health affecting pt's care  Document your decision making why or why not admission, treatments were needed:32947:::1}   Final diagnoses:  Hypertension, unspecified type    ED Discharge Orders     None

## 2024-11-08 ENCOUNTER — Emergency Department (HOSPITAL_COMMUNITY)

## 2024-11-08 ENCOUNTER — Emergency Department (HOSPITAL_COMMUNITY): Admission: EM | Admit: 2024-11-08 | Discharge: 2024-11-08 | Disposition: A | Discharge status: Home / Self Care

## 2024-11-08 DIAGNOSIS — Q046 Congenital cerebral cysts: Secondary | ICD-10-CM | POA: Diagnosis not present

## 2024-11-08 DIAGNOSIS — Z79899 Other long term (current) drug therapy: Secondary | ICD-10-CM | POA: Insufficient documentation

## 2024-11-08 DIAGNOSIS — Z8673 Personal history of transient ischemic attack (TIA), and cerebral infarction without residual deficits: Secondary | ICD-10-CM | POA: Insufficient documentation

## 2024-11-08 DIAGNOSIS — Z7984 Long term (current) use of oral hypoglycemic drugs: Secondary | ICD-10-CM | POA: Diagnosis not present

## 2024-11-08 DIAGNOSIS — I10 Essential (primary) hypertension: Secondary | ICD-10-CM | POA: Insufficient documentation

## 2024-11-08 DIAGNOSIS — E119 Type 2 diabetes mellitus without complications: Secondary | ICD-10-CM | POA: Insufficient documentation

## 2024-11-08 DIAGNOSIS — R519 Headache, unspecified: Secondary | ICD-10-CM | POA: Diagnosis present

## 2024-11-08 DIAGNOSIS — R4182 Altered mental status, unspecified: Secondary | ICD-10-CM | POA: Diagnosis not present

## 2024-11-08 DIAGNOSIS — I159 Secondary hypertension, unspecified: Secondary | ICD-10-CM

## 2024-11-08 LAB — CBC
HCT: 43.2 % (ref 39.0–52.0)
Hemoglobin: 14 g/dL (ref 13.0–17.0)
MCH: 29.7 pg (ref 26.0–34.0)
MCHC: 32.4 g/dL (ref 30.0–36.0)
MCV: 91.7 fL (ref 80.0–100.0)
Platelets: 220 K/uL (ref 150–400)
RBC: 4.71 MIL/uL (ref 4.22–5.81)
RDW: 13.5 % (ref 11.5–15.5)
WBC: 5 K/uL (ref 4.0–10.5)
nRBC: 0 % (ref 0.0–0.2)

## 2024-11-08 LAB — URINALYSIS, W/ REFLEX TO CULTURE (INFECTION SUSPECTED)
Bacteria, UA: NONE SEEN
Bilirubin Urine: NEGATIVE
Glucose, UA: NEGATIVE mg/dL
Ketones, ur: NEGATIVE mg/dL
Leukocytes,Ua: NEGATIVE
Nitrite: NEGATIVE
Protein, ur: NEGATIVE mg/dL
Specific Gravity, Urine: 1.009 (ref 1.005–1.030)
pH: 7 (ref 5.0–8.0)

## 2024-11-08 LAB — BASIC METABOLIC PANEL WITH GFR
Anion gap: 11 (ref 5–15)
BUN: 8 mg/dL (ref 8–23)
CO2: 26 mmol/L (ref 22–32)
Calcium: 9.8 mg/dL (ref 8.9–10.3)
Chloride: 98 mmol/L (ref 98–111)
Creatinine, Ser: 0.83 mg/dL (ref 0.61–1.24)
GFR, Estimated: >60 mL/min (ref >60)
Glucose, Bld: 111 mg/dL — ABNORMAL HIGH (ref 70–99)
Potassium: 4.2 mmol/L (ref 3.5–5.1)
Sodium: 135 mmol/L (ref 135–145)

## 2024-11-08 LAB — TROPONIN T, HIGH SENSITIVITY: Troponin T High Sensitivity: <15 ng/L (ref 0–19)

## 2024-11-08 NOTE — ED Triage Notes (Addendum)
 PT arrives via EMS from home with complaints of HYPERtension. PT was seen here yesterday, and instructed to increase his metoprolol. Pts BP was elevated at home again this morning and pt called his daughter whom instructed him to call EMS. Pt is asymptomatic. Pt appears to be excited.   EMS VS  P- 70/min BP- 172/98 O2-98%  Glucose 133mg /dL.  EMS advises that the pt is upset with staff here.

## 2024-11-08 NOTE — ED Notes (Signed)
 Pt is taking his home afternoon dose of clonidine. Pt given permission to do so by Dr. Neysa.

## 2024-11-08 NOTE — Discharge Instructions (Signed)
 Please follow-up with your primary doctor.  Return immediately for severe headache, vision loss, facial droop, chest pain, difficulty breathing, stop making urine, seizures, lethargy, severe pain or any new or worsening symptoms that are concerning to you.  As discussed, please take a log of your blood pressure morning and evening and discussed these results with your primary doctor regarding further antihypertensive medication adjustments.

## 2024-11-08 NOTE — ED Notes (Signed)
 Pt is aware a urine sample is needed. Urinal placed at bedside within reach. No complaints at this time. Family at bedside.

## 2024-11-08 NOTE — ED Notes (Signed)
 Urinal placed at bedside and pt asked to collect urine specimen

## 2024-11-08 NOTE — ED Provider Notes (Signed)
 [place] EMERGENCY DEPARTMENT AT [facility] Provider Note   CSN: [id number] Arrival date & time: 11/08/24  1410     Patient presents with: Hypertension   Anthony Beasley Sr. is a 83 y.o. male.   This is a 83 year old male presenting emergency department elevated blood pressure.  Was seen yesterday for the same.  Took increased dose of metoprolol, but blood pressure still elevated at home.  Reports some stress with some family health issues.  Reportedly feeling somewhat lightheaded and spacey.  Had a mild headache couple days ago, but none currently or since.  No vision loss, no facial droop, no unilateral weakness.  Reports he feels somewhat wobbly on his feet, but a chronic issue for him.  Denies vertigo or disequilibrium.  Family reports that he seemed more confused yesterday.  They are concerned for UTI.  He denies dysuria.  Denies chest pain, shortness of breath, dyspnea on exertion, no abdominal pain.   Hypertension       Prior to Admission medications   Medication Sig Start Date End Date Taking? Authorizing Provider  bimatoprost (LUMIGAN) 0.01 % SOLN Place 1 drop into both eyes at bedtime.     [provider]  cloNIDine (CATAPRES) 0.2 MG tablet Take 0.2 mg by mouth 3 (three) times daily.    [provider]  dorzolamide-timolol (COSOPT) 22.3-6.8 MG/ML ophthalmic solution INSTILL 1 DROP INTO EACH EYE TWICE DAILY Ophthalmic for 38 Days    [provider]  gabapentin (NEURONTIN) 300 MG capsule Take 300 mg by mouth 2 (two) times daily as needed (neuropathy).  12/13/18   [provider]  metFORMIN (GLUCOPHAGE-XR) 500 MG 24 hr tablet Take 500 mg by mouth daily with supper. 01/02/19   [provider]  metoprolol succinate (TOPROL-XL) 50 MG 24 hr tablet Take 1 tablet by mouth 2 (two) times daily.    [provider]  phenytoin  (DILANTIN ) 100 MG ER capsule Take 100 mg by mouth 2 (two) times daily.     [provider]   rosuvastatin (CRESTOR) 5 MG tablet Take 5 mg by mouth daily. 02/25/22   [provider]  tamsulosin (FLOMAX) 0.4 MG CAPS capsule Take 0.4 mg by mouth 2 (two) times daily. 03/30/22   [provider]  triamterene-hydrochlorothiazide (MAXZIDE-25) 37.5-25 MG tablet Take 1 tablet by mouth every morning.    [provider]  verapamil (CALAN-SR) 240 MG CR tablet Take 240 mg by mouth daily.     [provider]  warfarin (COUMADIN ) 5 MG tablet Take 1 tablet (5 mg total) by mouth daily. Or as instructed by doctor's office. 02/07/23   Barbarann Dixon B, RPH-CPP    Allergies: Clindamycin/lincomycin, Grapefruit concentrate, Ace inhibitors, and Ciprofloxacin    Review of Systems  Updated Vital Signs BP (!) 184/98   Pulse 62   Temp 97.7 F (36.5 C) (Oral)   Resp 15   Ht 6' (1.829 m)   Wt 87.5 kg   SpO2 100%   BMI 26.18 kg/m   Physical Exam Vitals and nursing note reviewed.  Constitutional:      General: He is not in acute distress.    Appearance: He is not toxic-appearing.  HENT:     Head: Normocephalic.     Nose: Nose normal.     Mouth/Throat:     Mouth: Mucous membranes are moist.  Eyes:     Conjunctiva/sclera: Conjunctivae normal.  Cardiovascular:     Rate and Rhythm: Normal rate and regular rhythm.  Pulses: Normal pulses.  Pulmonary:     Effort: Pulmonary effort is normal.     Breath sounds: Normal breath sounds.  Abdominal:     General: Abdomen is flat. There is no distension.     Palpations: Abdomen is soft.     Tenderness: There is no abdominal tenderness. There is no guarding or rebound.  Musculoskeletal:        General: Normal range of motion.  Skin:    General: Skin is warm and dry.     Capillary Refill: Capillary refill takes less than 2 seconds.  Neurological:     General: No focal deficit present.     Mental Status: He is alert and oriented to person, place, and time.     Cranial Nerves: No cranial nerve deficit.     Sensory: No  sensory deficit.     Motor: No weakness.     Coordination: Coordination normal.  Psychiatric:        Mood and Affect: Mood normal.        Behavior: Behavior normal.     (all labs ordered are listed, but only abnormal results are displayed) Labs Reviewed  URINALYSIS, W/ REFLEX TO CULTURE (INFECTION SUSPECTED) - Abnormal; Notable for the following components:      Result Value   Hgb urine dipstick SMALL (*)    All other components within normal limits  BASIC METABOLIC PANEL WITH GFR - Abnormal; Notable for the following components:   Glucose, Bld 111 (*)    All other components within normal limits  CBC  TROPONIN T, HIGH SENSITIVITY    EKG: EKG Interpretation Date/Time:  Sunday November 08 2024 15:48:25 EST Ventricular Rate:  62 PR Interval:  189 QRS Duration:  99 QT Interval:  399 QTC Calculation: 406 R Axis:   33  Text Interpretation: Sinus rhythm Minimal ST elevation, anterior leads No significant change was found Confirmed by Neysa Clap [phone]) on 11/08/2024 5:13:56 PM  Radiology: CT Head Wo Contrast Result Date: 11/08/2024 CLINICAL DATA:  Mental status change of unknown cause. EXAM: CT HEAD WITHOUT CONTRAST TECHNIQUE: Contiguous axial images were obtained from the base of the skull through the vertex without intravenous contrast. RADIATION DOSE REDUCTION: This exam was performed according to the departmental dose-optimization program which includes automated exposure control, adjustment of the mA and/or kV according to patient size and/or use of iterative reconstruction technique. COMPARISON:  MRI 01/17/2016 FINDINGS: Brain: No abnormality seen affecting the brainstem or cerebellum. Left cerebral hemisphere shows chronic small-vessel ischemic changes of the white matter but no acute stroke. Right hemisphere shows an old infarction in the MCA territory affecting the deep insula and right frontoparietal cortical and subcortical brain with encephalomalacia and porencephaly. No  sign of acute infarction, mass lesion, hemorrhage, hydrocephalus or extra-axial collection. Vascular: There is atherosclerotic calcification of the major vessels at the base of the brain. Skull: Negative Sinuses/Orbits: Clear/normal Other: None IMPRESSION: No acute finding. 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 Electronically Signed   By: Oneil Officer M.D.   On: 11/08/2024 15:46   DG Chest Port 1 View Result Date: 11/07/2024 CLINICAL DATA:  High blood pressure yesterday.  Short of breath. EXAM: PORTABLE CHEST 1 VIEW COMPARISON:  01/22/2019. FINDINGS: Cardiac silhouette is normal in size. No mediastinal or hilar masses. Clear lungs.  No pleural effusion or pneumothorax. Skeletal structures are grossly intact. IMPRESSION: No active disease. Electronically Signed   By: Alm Avanell HERO.D.  On: 11/07/2024 11:44     Procedures   Medications Ordered in the ED - No data to display  Clinical Course as of 11/08/24 1731  Sun Nov 08, 2024  1609 CT Head Wo Contrast IMPRESSION: No acute finding. 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   [TY]    Clinical Course User Index [TY] Neysa Caron PARAS, DO                                 Medical Decision Making Is an 83 year old male past medical history include diabetes, prior stroke, hypertension, seizures, hyperlipidemia presenting emergency department with elevated blood pressure.  Per chart review he was in the 200s systolic yesterday, blood pressures 160s to 170s here.  He seems to be mostly asymptomatic, but does endorse some minor lightheadedness.  Lungs are clear, acute CHF/pulmonary edema less likely.  Also, chest x-ray yesterday negative.  Equal pulses, chest x-ray yesterday without widened mediastinum.  Not having chest pain or back pain to suggest dissection.  Reviewed labs from yesterday, no significant  abnormalities that would suggest endorgan damage.  He has a normal neuro exam and I do not appreciate any focal deficits or discoordination.  However given elevated blood pressure and his complaint of feeling wobbly will get CT head to evaluate for intracranial pathology that would explain his elevated blood pressure, although I am less suspicious for stroke.  Will get repeat labs.  Family concerned for UTI, will check urine.  Update.  Repeat labs with no evidence of endorgan damage.  Normal kidney function.  Troponin normal.  He has no leukocytosis to suggest infectious process.  Urine without UTI.  CT scan without acute findings.  Blood pressure is still lower than it was yesterday when was in the 200s.  Discussed close follow-up with PCP.  Patient and family states they will see them tomorrow.  Will discharge in stable condition.  Amount and/or Complexity of Data Reviewed Independent Historian:     Details: Daughter at baseline states he seems normal currently External Data Reviewed: labs.    Details: See above Labs: ordered. Decision-making details documented in ED Course. Radiology: ordered and independent interpretation performed. Decision-making details documented in ED Course. ECG/medicine tests: ordered and independent interpretation performed. Decision-making details documented in ED Course.  Risk Prescription drug management. Decision regarding hospitalization. Diagnosis or treatment significantly limited by social determinants of health.      Final diagnoses:  None    ED Discharge Orders     None          Neysa Caron PARAS, DO 11/08/24 1731

## 2024-11-11 ENCOUNTER — Other Ambulatory Visit (HOSPITAL_COMMUNITY): Payer: Self-pay | Admitting: Registered Nurse

## 2024-11-11 DIAGNOSIS — I10 Essential (primary) hypertension: Secondary | ICD-10-CM

## 2024-11-20 ENCOUNTER — Ambulatory Visit (HOSPITAL_COMMUNITY)
Admission: RE | Admit: 2024-11-20 | Discharge: 2024-11-20 | Disposition: A | Source: Ambulatory Visit | Attending: Registered Nurse

## 2024-11-20 DIAGNOSIS — I10 Essential (primary) hypertension: Secondary | ICD-10-CM

## 2024-12-16 NOTE — Progress Notes (Signed)
 Anthony Pence Sr.                                          MRN: [id number]   12/16/2024   The [facility] Quality Team Specialist reviewed this patient medical record for the purposes of chart review for care gap closure. The following were reviewed: chart review for care gap closure-controlling blood pressure.    [facility]BCI Quality Team

## 2024-12-28 NOTE — Assessment & Plan Note (Addendum)
 Patient has been on warfarin Continue follow-up for INR with PCP Follow-up with us  in about 6 months, earlier if needed.

## 2024-12-28 NOTE — Progress Notes (Unsigned)
 [facility] OFFICE PROGRESS NOTE  Patient Care Team: Yolande Toribio MATSU, MD as PCP - General (Internal Medicine)  84 y.o.male with history of prostate cancer treated with seed implants, diabetes mellitus type 2, glaucoma, hyperlipidemia, hypertension, stroke, tubular adenoma of colon and DVT referred to [facility] for history of RLE DVT. Last DVT was seen in 01/2023.   First episode: 2024.  Setting: unprovoked  Risk factors: Unprovoked, personal history of VTE.  Family history of VTE.  Age, male, developed DVT while on aspirin . Treatment: warfarin alternating 2.5 mg and 5 mg every other day. (Due to interaction of dilantin  with apixaban)   Discussed risks and benefits of continuing anticoagulation.  Discussed risks of bleeding.  Discussed risks of recurrent thrombosis if discontinuing anticoagulation.  Discussed that he may watch for new symptoms if he wishes to discontinue anticoagulation.  After discussion, he is more comfortable continuing anticoagulation at this time as he does not want any recurrence. Assessment & Plan History of DVT in adulthood Patient has been on warfarin Continue follow-up for INR with PCP Follow-up with us  in about 6 months, earlier if needed.  No orders of the defined types were placed in this encounter.    Pauletta JAYSON Chihuahua, MD  INTERVAL HISTORY: Patient returns for follow-up.  Oncology History   No problem history exists.     PHYSICAL EXAMINATION: ECOG PERFORMANCE STATUS: {CHL ONC ECOG PS:[id number]}  There were no vitals filed for this visit. There were no vitals filed for this visit.  Relevant data reviewed during this visit included ***

## 2024-12-29 ENCOUNTER — Inpatient Hospital Stay

## 2024-12-29 DIAGNOSIS — Z86718 Personal history of other venous thrombosis and embolism: Secondary | ICD-10-CM

## 2025-02-01 ENCOUNTER — Inpatient Hospital Stay
